# Patient Record
Sex: Female | Born: 1970 | Race: White | Hispanic: No | Smoking: Never smoker
Health system: Southern US, Community
[De-identification: ages and names within clinical notes are randomized; demographics above are authoritative.]

## PROBLEM LIST (undated history)

## (undated) DIAGNOSIS — R569 Unspecified convulsions: Secondary | ICD-10-CM

## (undated) DIAGNOSIS — I493 Ventricular premature depolarization: Secondary | ICD-10-CM

## (undated) DIAGNOSIS — K219 Gastro-esophageal reflux disease without esophagitis: Secondary | ICD-10-CM

## (undated) DIAGNOSIS — I959 Hypotension, unspecified: Secondary | ICD-10-CM

## (undated) DIAGNOSIS — F419 Anxiety disorder, unspecified: Secondary | ICD-10-CM

## (undated) DIAGNOSIS — G250 Essential tremor: Secondary | ICD-10-CM

## (undated) DIAGNOSIS — Z8711 Personal history of peptic ulcer disease: Secondary | ICD-10-CM

## (undated) HISTORY — DX: Hypotension, unspecified: I95.9

## (undated) HISTORY — DX: Gastro-esophageal reflux disease without esophagitis: K21.9

## (undated) HISTORY — PX: CHOLECYSTECTOMY: SHX55

## (undated) HISTORY — PX: ESOPHAGOGASTRODUODENOSCOPY: SHX1529

## (undated) HISTORY — PX: GASTRIC BYPASS: SHX52

## (undated) HISTORY — DX: Unspecified convulsions: R56.9

## (undated) HISTORY — DX: Ventricular premature depolarization: I49.3

## (undated) HISTORY — DX: Personal history of peptic ulcer disease: Z87.11

## (undated) HISTORY — DX: Essential tremor: G25.0

## (undated) HISTORY — PX: BREAST BIOPSY: SHX20

## (undated) HISTORY — DX: Anxiety disorder, unspecified: F41.9

---

## 2013-11-10 DIAGNOSIS — I493 Ventricular premature depolarization: Secondary | ICD-10-CM | POA: Insufficient documentation

## 2013-11-10 DIAGNOSIS — G40909 Epilepsy, unspecified, not intractable, without status epilepticus: Secondary | ICD-10-CM | POA: Insufficient documentation

## 2013-11-10 DIAGNOSIS — Z9884 Bariatric surgery status: Secondary | ICD-10-CM | POA: Insufficient documentation

## 2013-11-10 DIAGNOSIS — E538 Deficiency of other specified B group vitamins: Secondary | ICD-10-CM | POA: Insufficient documentation

## 2013-11-10 DIAGNOSIS — K219 Gastro-esophageal reflux disease without esophagitis: Secondary | ICD-10-CM | POA: Insufficient documentation

## 2013-12-25 DIAGNOSIS — Z803 Family history of malignant neoplasm of breast: Secondary | ICD-10-CM | POA: Insufficient documentation

## 2013-12-25 DIAGNOSIS — Z8041 Family history of malignant neoplasm of ovary: Secondary | ICD-10-CM | POA: Insufficient documentation

## 2014-02-18 DIAGNOSIS — Z9189 Other specified personal risk factors, not elsewhere classified: Secondary | ICD-10-CM | POA: Insufficient documentation

## 2016-01-08 DIAGNOSIS — G250 Essential tremor: Secondary | ICD-10-CM | POA: Insufficient documentation

## 2017-10-25 DIAGNOSIS — M7501 Adhesive capsulitis of right shoulder: Secondary | ICD-10-CM | POA: Insufficient documentation

## 2017-10-25 DIAGNOSIS — M7502 Adhesive capsulitis of left shoulder: Secondary | ICD-10-CM | POA: Insufficient documentation

## 2019-02-21 ENCOUNTER — Ambulatory Visit: Payer: 59 | Admitting: Internal Medicine

## 2019-02-25 ENCOUNTER — Ambulatory Visit: Payer: 59 | Admitting: Internal Medicine

## 2019-02-26 ENCOUNTER — Encounter: Payer: Self-pay | Admitting: Internal Medicine

## 2019-02-26 ENCOUNTER — Other Ambulatory Visit: Payer: Self-pay

## 2019-02-26 ENCOUNTER — Ambulatory Visit: Payer: 59 | Admitting: Internal Medicine

## 2019-02-26 VITALS — BP 103/68 | HR 62 | Temp 98.3°F | Resp 14 | Ht 65.0 in | Wt 170.0 lb

## 2019-02-26 DIAGNOSIS — G250 Essential tremor: Secondary | ICD-10-CM

## 2019-02-26 DIAGNOSIS — I493 Ventricular premature depolarization: Secondary | ICD-10-CM

## 2019-02-26 DIAGNOSIS — K219 Gastro-esophageal reflux disease without esophagitis: Secondary | ICD-10-CM

## 2019-02-26 DIAGNOSIS — R519 Headache, unspecified: Secondary | ICD-10-CM | POA: Insufficient documentation

## 2019-02-26 DIAGNOSIS — Z760 Encounter for issue of repeat prescription: Secondary | ICD-10-CM

## 2019-02-26 MED ORDER — PRIMIDONE 50 MG PO TABS: 50.0000 mg | ORAL_TABLET | Freq: Two times a day (BID) | ORAL | 3 refills | Status: DC

## 2019-02-26 MED ORDER — TOPIRAMATE 50 MG PO TABS: 50.0000 mg | ORAL_TABLET | Freq: Two times a day (BID) | ORAL | 3 refills | Status: DC

## 2019-02-26 MED ORDER — BYSTOLIC 5 MG PO TABS: 5.0000 mg | ORAL_TABLET | Freq: Every day | ORAL | 3 refills | Status: DC

## 2019-02-26 MED ORDER — OMEPRAZOLE 40 MG PO CPDR: 40.0000 mg | DELAYED_RELEASE_CAPSULE | Freq: Every day | ORAL | 3 refills | Status: DC

## 2019-02-26 NOTE — Progress Notes (Signed)
S -48 year old white female who presents for medication refills.  She recently relocated to the area, and is in need of refills for her medication she is currently taking.  Bystolic is a beta-blocker which was started for PVCs, and is helpful in controlling those.  Omeprazole is used for GERD symptoms, and she notes when she does not take that she is very symptomatic.  She does need it daily.  The primidone was started for a benign essential tremor which she has had since youth, and has been successful in controlling.  The topiramate was prescribed for control of headaches, and it has been very helpful.  She denies any concerning side effects with taking these medications.  She works as the Glass blower/designer in our medical office currently.   No other complaints   Allergies  Allergen Reactions  . Adhesive [Tape]   . Levaquin [Levofloxacin]   . Sulfa Antibiotics   . Zoloft [Sertraline Hcl]    Meds reviewed and as above and in the mediation tab  No tob hx  O - NAD BP 103/68 (BP Location: Right Arm, Patient Position: Sitting, Cuff Size: Large)   Pulse 62   Temp 98.3 F (36.8 C) (Oral)   Resp 14   Ht 5\' 5"  (1.651 m)   Wt 170 lb (77.1 kg)   LMP  (LMP Unknown)   SpO2 99%   BMI 28.29 kg/m   Affect was not flat, approp with conversation Further exam limited today  Ass/plan  1.  Medication refills- as above noted for the problems noted above.  The medications were refilled as requested, and she did ask for a 90-day supply with refills which I felt was appropriate. Follow-up as needed.

## 2019-02-27 DIAGNOSIS — Z1159 Encounter for screening for other viral diseases: Secondary | ICD-10-CM | POA: Diagnosis not present

## 2019-10-02 ENCOUNTER — Telehealth: Payer: Self-pay

## 2019-10-02 ENCOUNTER — Other Ambulatory Visit: Payer: Self-pay

## 2019-10-02 DIAGNOSIS — R197 Diarrhea, unspecified: Secondary | ICD-10-CM

## 2019-10-02 NOTE — Telephone Encounter (Signed)
Patient presents requesting gastro referral for chronic diarrhea.  AMD

## 2019-10-03 ENCOUNTER — Other Ambulatory Visit: Payer: 59

## 2019-10-03 ENCOUNTER — Other Ambulatory Visit: Payer: Self-pay

## 2019-10-03 DIAGNOSIS — Z0000 Encounter for general adult medical examination without abnormal findings: Secondary | ICD-10-CM

## 2019-10-04 LAB — CMP12+LP+TP+TSH+6AC+CBC/D/PLT
ALT: 87 IU/L — ABNORMAL HIGH (ref 0–32)
AST: 84 IU/L — ABNORMAL HIGH (ref 0–40)
Albumin/Globulin Ratio: 2 (ref 1.2–2.2)
Albumin: 4.3 g/dL (ref 3.8–4.8)
Alkaline Phosphatase: 95 IU/L (ref 39–117)
BUN/Creatinine Ratio: 8 — ABNORMAL LOW (ref 9–23)
BUN: 7 mg/dL (ref 6–24)
Basophils Absolute: 0 10*3/uL (ref 0.0–0.2)
Basos: 1 %
Bilirubin Total: <0.2 mg/dL (ref 0.0–1.2)
Calcium: 9.2 mg/dL (ref 8.7–10.2)
Chloride: 112 mmol/L — ABNORMAL HIGH (ref 96–106)
Chol/HDL Ratio: 2.1 ratio (ref 0.0–4.4)
Cholesterol, Total: 136 mg/dL (ref 100–199)
Creatinine, Ser: 0.93 mg/dL (ref 0.57–1.00)
EOS (ABSOLUTE): 0.2 10*3/uL (ref 0.0–0.4)
Eos: 4 %
Estimated CHD Risk: <0.5 times avg. (ref 0.0–1.0)
Free Thyroxine Index: 1.8 (ref 1.2–4.9)
GFR calc Af Amer: 84 mL/min/{1.73_m2} (ref >59)
GFR calc non Af Amer: 73 mL/min/{1.73_m2} (ref >59)
GGT: 76 IU/L — ABNORMAL HIGH (ref 0–60)
Globulin, Total: 2.1 g/dL (ref 1.5–4.5)
Glucose: 77 mg/dL (ref 65–99)
HDL: 64 mg/dL (ref >39)
Hematocrit: 34.3 % (ref 34.0–46.6)
Hemoglobin: 11.2 g/dL (ref 11.1–15.9)
Immature Grans (Abs): 0 10*3/uL (ref 0.0–0.1)
Immature Granulocytes: 0 %
Iron: 32 ug/dL (ref 27–159)
LDH: 205 IU/L (ref 119–226)
LDL Chol Calc (NIH): 58 mg/dL (ref 0–99)
Lymphocytes Absolute: 1.7 10*3/uL (ref 0.7–3.1)
Lymphs: 36 %
MCH: 28.6 pg (ref 26.6–33.0)
MCHC: 32.7 g/dL (ref 31.5–35.7)
MCV: 88 fL (ref 79–97)
Monocytes Absolute: 0.5 10*3/uL (ref 0.1–0.9)
Monocytes: 11 %
Neutrophils Absolute: 2.2 10*3/uL (ref 1.4–7.0)
Neutrophils: 48 %
Phosphorus: 3.4 mg/dL (ref 3.0–4.3)
Platelets: 207 10*3/uL (ref 150–450)
Potassium: 4.5 mmol/L (ref 3.5–5.2)
RBC: 3.92 x10E6/uL (ref 3.77–5.28)
RDW: 13 % (ref 11.7–15.4)
Sodium: 145 mmol/L — ABNORMAL HIGH (ref 134–144)
T3 Uptake Ratio: 23 % — ABNORMAL LOW (ref 24–39)
T4, Total: 7.9 ug/dL (ref 4.5–12.0)
TSH: 1.28 u[IU]/mL (ref 0.450–4.500)
Total Protein: 6.4 g/dL (ref 6.0–8.5)
Triglycerides: 70 mg/dL (ref 0–149)
Uric Acid: 3.1 mg/dL (ref 2.6–6.2)
VLDL Cholesterol Cal: 14 mg/dL (ref 5–40)
WBC: 4.6 10*3/uL (ref 3.4–10.8)

## 2019-10-04 LAB — VITAMIN B12: Vitamin B-12: 1326 pg/mL — ABNORMAL HIGH (ref 232–1245)

## 2019-10-04 LAB — MAGNESIUM: Magnesium: 2 mg/dL (ref 1.6–2.3)

## 2019-10-04 LAB — ZINC

## 2019-10-04 LAB — VITAMIN D 25 HYDROXY (VIT D DEFICIENCY, FRACTURES): Vit D, 25-Hydroxy: 26.7 ng/mL — ABNORMAL LOW (ref 30.0–100.0)

## 2019-10-09 ENCOUNTER — Other Ambulatory Visit: Payer: Self-pay

## 2019-10-09 DIAGNOSIS — Z0000 Encounter for general adult medical examination without abnormal findings: Secondary | ICD-10-CM

## 2019-10-09 NOTE — Progress Notes (Signed)
Labs for upcoming MD appointment.  AMD

## 2019-10-10 LAB — CMP12+LP+TP+TSH+6AC+CBC/D/PLT
ALT: 57 IU/L — ABNORMAL HIGH (ref 0–32)
AST: 50 IU/L — ABNORMAL HIGH (ref 0–40)
Albumin/Globulin Ratio: 1.8 (ref 1.2–2.2)
Albumin: 4.2 g/dL (ref 3.8–4.8)
Alkaline Phosphatase: 82 IU/L (ref 39–117)
BUN/Creatinine Ratio: 8 — ABNORMAL LOW (ref 9–23)
BUN: 7 mg/dL (ref 6–24)
Basophils Absolute: 0 10*3/uL (ref 0.0–0.2)
Basos: 0 %
Bilirubin Total: 0.2 mg/dL (ref 0.0–1.2)
Calcium: 9.3 mg/dL (ref 8.7–10.2)
Chloride: 109 mmol/L — ABNORMAL HIGH (ref 96–106)
Chol/HDL Ratio: 2.5 ratio (ref 0.0–4.4)
Cholesterol, Total: 145 mg/dL (ref 100–199)
Creatinine, Ser: 0.83 mg/dL (ref 0.57–1.00)
EOS (ABSOLUTE): 0.2 10*3/uL (ref 0.0–0.4)
Eos: 4 %
Estimated CHD Risk: <0.5 times avg. (ref 0.0–1.0)
Free Thyroxine Index: 1.8 (ref 1.2–4.9)
GFR calc Af Amer: 96 mL/min/{1.73_m2} (ref >59)
GFR calc non Af Amer: 84 mL/min/{1.73_m2} (ref >59)
GGT: 55 IU/L (ref 0–60)
Globulin, Total: 2.4 g/dL (ref 1.5–4.5)
Glucose: 84 mg/dL (ref 65–99)
HDL: 59 mg/dL (ref >39)
Hematocrit: 34.8 % (ref 34.0–46.6)
Hemoglobin: 11.1 g/dL (ref 11.1–15.9)
Immature Grans (Abs): 0 10*3/uL (ref 0.0–0.1)
Immature Granulocytes: 0 %
Iron: 83 ug/dL (ref 27–159)
LDH: 170 IU/L (ref 119–226)
LDL Chol Calc (NIH): 71 mg/dL (ref 0–99)
Lymphocytes Absolute: 1.9 10*3/uL (ref 0.7–3.1)
Lymphs: 39 %
MCH: 28.2 pg (ref 26.6–33.0)
MCHC: 31.9 g/dL (ref 31.5–35.7)
MCV: 89 fL (ref 79–97)
Monocytes Absolute: 0.5 10*3/uL (ref 0.1–0.9)
Monocytes: 9 %
Neutrophils Absolute: 2.4 10*3/uL (ref 1.4–7.0)
Neutrophils: 48 %
Phosphorus: 4 mg/dL (ref 3.0–4.3)
Platelets: 199 10*3/uL (ref 150–450)
Potassium: 4.2 mmol/L (ref 3.5–5.2)
RBC: 3.93 x10E6/uL (ref 3.77–5.28)
RDW: 13.1 % (ref 11.7–15.4)
Sodium: 142 mmol/L (ref 134–144)
T3 Uptake Ratio: 24 % (ref 24–39)
T4, Total: 7.7 ug/dL (ref 4.5–12.0)
TSH: 1.28 u[IU]/mL (ref 0.450–4.500)
Total Protein: 6.6 g/dL (ref 6.0–8.5)
Triglycerides: 80 mg/dL (ref 0–149)
Uric Acid: 3.2 mg/dL (ref 2.6–6.2)
VLDL Cholesterol Cal: 15 mg/dL (ref 5–40)
WBC: 5 10*3/uL (ref 3.4–10.8)

## 2019-10-18 DIAGNOSIS — R197 Diarrhea, unspecified: Secondary | ICD-10-CM | POA: Diagnosis not present

## 2019-10-18 DIAGNOSIS — R634 Abnormal weight loss: Secondary | ICD-10-CM | POA: Diagnosis not present

## 2019-10-21 DIAGNOSIS — K257 Chronic gastric ulcer without hemorrhage or perforation: Secondary | ICD-10-CM | POA: Diagnosis not present

## 2019-10-21 DIAGNOSIS — Z9884 Bariatric surgery status: Secondary | ICD-10-CM | POA: Diagnosis not present

## 2019-10-21 DIAGNOSIS — R197 Diarrhea, unspecified: Secondary | ICD-10-CM | POA: Diagnosis not present

## 2019-10-21 DIAGNOSIS — R634 Abnormal weight loss: Secondary | ICD-10-CM | POA: Diagnosis not present

## 2019-10-21 HISTORY — PX: COLONOSCOPY: SHX174

## 2019-10-21 LAB — HM COLONOSCOPY

## 2019-10-28 DIAGNOSIS — L853 Xerosis cutis: Secondary | ICD-10-CM | POA: Diagnosis not present

## 2019-11-22 ENCOUNTER — Other Ambulatory Visit: Payer: Self-pay

## 2019-11-22 DIAGNOSIS — Z0000 Encounter for general adult medical examination without abnormal findings: Secondary | ICD-10-CM

## 2019-11-27 LAB — CMP12+LP+TP+TSH+6AC+CBC/D/PLT
ALT: 21 IU/L (ref 0–32)
AST: 28 IU/L (ref 0–40)
Albumin/Globulin Ratio: 1.8 (ref 1.2–2.2)
Albumin: 3.9 g/dL (ref 3.8–4.8)
Alkaline Phosphatase: 82 IU/L (ref 48–121)
BUN/Creatinine Ratio: 9 (ref 9–23)
BUN: 8 mg/dL (ref 6–24)
Basophils Absolute: 0 10*3/uL (ref 0.0–0.2)
Basos: 1 %
Bilirubin Total: <0.2 mg/dL (ref 0.0–1.2)
Calcium: 9.8 mg/dL (ref 8.7–10.2)
Chloride: 105 mmol/L (ref 96–106)
Chol/HDL Ratio: 2 ratio (ref 0.0–4.4)
Cholesterol, Total: 151 mg/dL (ref 100–199)
Creatinine, Ser: 0.88 mg/dL (ref 0.57–1.00)
EOS (ABSOLUTE): 0.2 10*3/uL (ref 0.0–0.4)
Eos: 3 %
Estimated CHD Risk: <0.5 times avg. (ref 0.0–1.0)
Free Thyroxine Index: 1.5 (ref 1.2–4.9)
GFR calc Af Amer: 90 mL/min/{1.73_m2} (ref >59)
GFR calc non Af Amer: 78 mL/min/{1.73_m2} (ref >59)
GGT: 14 IU/L (ref 0–60)
Globulin, Total: 2.2 g/dL (ref 1.5–4.5)
Glucose: 83 mg/dL (ref 65–99)
HDL: 76 mg/dL (ref >39)
Hematocrit: 33 % — ABNORMAL LOW (ref 34.0–46.6)
Hemoglobin: 10.7 g/dL — ABNORMAL LOW (ref 11.1–15.9)
Immature Grans (Abs): 0 10*3/uL (ref 0.0–0.1)
Immature Granulocytes: 0 %
Iron: 26 ug/dL — ABNORMAL LOW (ref 27–159)
LDH: 149 IU/L (ref 119–226)
LDL Chol Calc (NIH): 64 mg/dL (ref 0–99)
Lymphocytes Absolute: 2.1 10*3/uL (ref 0.7–3.1)
Lymphs: 39 %
MCH: 28.5 pg (ref 26.6–33.0)
MCHC: 32.4 g/dL (ref 31.5–35.7)
MCV: 88 fL (ref 79–97)
Monocytes Absolute: 0.5 10*3/uL (ref 0.1–0.9)
Monocytes: 9 %
Neutrophils Absolute: 2.6 10*3/uL (ref 1.4–7.0)
Neutrophils: 48 %
Phosphorus: 4.4 mg/dL — ABNORMAL HIGH (ref 3.0–4.3)
Platelets: 190 10*3/uL (ref 150–450)
Potassium: 3.8 mmol/L (ref 3.5–5.2)
RBC: 3.75 x10E6/uL — ABNORMAL LOW (ref 3.77–5.28)
RDW: 13.6 % (ref 11.7–15.4)
Sodium: 138 mmol/L (ref 134–144)
T3 Uptake Ratio: 24 % (ref 24–39)
T4, Total: 6.2 ug/dL (ref 4.5–12.0)
TSH: 1.39 u[IU]/mL (ref 0.450–4.500)
Total Protein: 6.1 g/dL (ref 6.0–8.5)
Triglycerides: 50 mg/dL (ref 0–149)
Uric Acid: 2.8 mg/dL (ref 2.6–6.2)
VLDL Cholesterol Cal: 11 mg/dL (ref 5–40)
WBC: 5.4 10*3/uL (ref 3.4–10.8)

## 2019-11-27 LAB — ZINC: Zinc: 72 ug/dL (ref 44–115)

## 2019-11-27 LAB — CA 125: Cancer Antigen (CA) 125: 13.7 U/mL (ref 0.0–38.1)

## 2019-11-27 LAB — VITAMIN D 25 HYDROXY (VIT D DEFICIENCY, FRACTURES): Vit D, 25-Hydroxy: 45.4 ng/mL (ref 30.0–100.0)

## 2019-12-03 DIAGNOSIS — Z1231 Encounter for screening mammogram for malignant neoplasm of breast: Secondary | ICD-10-CM | POA: Diagnosis not present

## 2019-12-09 DIAGNOSIS — Z01419 Encounter for gynecological examination (general) (routine) without abnormal findings: Secondary | ICD-10-CM | POA: Diagnosis not present

## 2020-02-20 ENCOUNTER — Telehealth: Payer: Self-pay | Admitting: Emergency Medicine

## 2020-02-20 MED ORDER — LORAZEPAM 0.5 MG PO TABS: 0.5000 mg | ORAL_TABLET | Freq: Two times a day (BID) | ORAL | 1 refills | Status: DC | PRN

## 2020-02-20 NOTE — Telephone Encounter (Signed)
Called in meds for anxiety due to recent stress

## 2020-04-13 DIAGNOSIS — G250 Essential tremor: Secondary | ICD-10-CM | POA: Diagnosis not present

## 2020-04-13 DIAGNOSIS — Z8041 Family history of malignant neoplasm of ovary: Secondary | ICD-10-CM | POA: Diagnosis not present

## 2020-04-13 DIAGNOSIS — K219 Gastro-esophageal reflux disease without esophagitis: Secondary | ICD-10-CM | POA: Diagnosis not present

## 2020-04-13 DIAGNOSIS — G40909 Epilepsy, unspecified, not intractable, without status epilepticus: Secondary | ICD-10-CM | POA: Diagnosis not present

## 2020-04-13 DIAGNOSIS — K52832 Lymphocytic colitis: Secondary | ICD-10-CM | POA: Diagnosis not present

## 2020-04-13 DIAGNOSIS — Z803 Family history of malignant neoplasm of breast: Secondary | ICD-10-CM | POA: Diagnosis not present

## 2020-05-28 ENCOUNTER — Other Ambulatory Visit (HOSPITAL_COMMUNITY): Payer: Self-pay | Admitting: Family Medicine

## 2020-06-08 DIAGNOSIS — M50322 Other cervical disc degeneration at C5-C6 level: Secondary | ICD-10-CM | POA: Diagnosis not present

## 2020-06-08 DIAGNOSIS — M5136 Other intervertebral disc degeneration, lumbar region: Secondary | ICD-10-CM | POA: Diagnosis not present

## 2020-06-08 DIAGNOSIS — M9901 Segmental and somatic dysfunction of cervical region: Secondary | ICD-10-CM | POA: Diagnosis not present

## 2020-06-08 DIAGNOSIS — M7918 Myalgia, other site: Secondary | ICD-10-CM | POA: Diagnosis not present

## 2020-06-27 DIAGNOSIS — F419 Anxiety disorder, unspecified: Secondary | ICD-10-CM

## 2020-06-27 HISTORY — DX: Anxiety disorder, unspecified: F41.9

## 2020-07-14 ENCOUNTER — Emergency Department (HOSPITAL_COMMUNITY)
Admission: EM | Admit: 2020-07-14 | Discharge: 2020-07-14 | Disposition: A | Discharge status: Home / Self Care | Payer: 59 | Attending: Emergency Medicine | Admitting: Emergency Medicine

## 2020-07-14 ENCOUNTER — Encounter (HOSPITAL_COMMUNITY): Payer: Self-pay | Admitting: Emergency Medicine

## 2020-07-14 ENCOUNTER — Emergency Department (HOSPITAL_COMMUNITY): Payer: 59

## 2020-07-14 ENCOUNTER — Encounter: Payer: Self-pay | Admitting: Neurology

## 2020-07-14 ENCOUNTER — Other Ambulatory Visit: Payer: Self-pay

## 2020-07-14 DIAGNOSIS — Z20822 Contact with and (suspected) exposure to covid-19: Secondary | ICD-10-CM | POA: Diagnosis not present

## 2020-07-14 DIAGNOSIS — R0602 Shortness of breath: Secondary | ICD-10-CM | POA: Insufficient documentation

## 2020-07-14 DIAGNOSIS — R7989 Other specified abnormal findings of blood chemistry: Secondary | ICD-10-CM | POA: Diagnosis not present

## 2020-07-14 DIAGNOSIS — R1013 Epigastric pain: Secondary | ICD-10-CM | POA: Insufficient documentation

## 2020-07-14 DIAGNOSIS — K219 Gastro-esophageal reflux disease without esophagitis: Secondary | ICD-10-CM | POA: Insufficient documentation

## 2020-07-14 DIAGNOSIS — R079 Chest pain, unspecified: Secondary | ICD-10-CM | POA: Insufficient documentation

## 2020-07-14 DIAGNOSIS — R0789 Other chest pain: Secondary | ICD-10-CM | POA: Diagnosis not present

## 2020-07-14 DIAGNOSIS — I493 Ventricular premature depolarization: Secondary | ICD-10-CM | POA: Diagnosis not present

## 2020-07-14 DIAGNOSIS — M549 Dorsalgia, unspecified: Secondary | ICD-10-CM | POA: Diagnosis not present

## 2020-07-14 DIAGNOSIS — I2699 Other pulmonary embolism without acute cor pulmonale: Secondary | ICD-10-CM | POA: Diagnosis not present

## 2020-07-14 DIAGNOSIS — R42 Dizziness and giddiness: Secondary | ICD-10-CM | POA: Diagnosis not present

## 2020-07-14 LAB — BASIC METABOLIC PANEL
Anion gap: 9 (ref 5–15)
BUN: 9 mg/dL (ref 6–20)
CO2: 22 mmol/L (ref 22–32)
Calcium: 9.6 mg/dL (ref 8.9–10.3)
Chloride: 105 mmol/L (ref 98–111)
Creatinine, Ser: 0.92 mg/dL (ref 0.44–1.00)
GFR, Estimated: >60 mL/min (ref >60)
Glucose, Bld: 97 mg/dL (ref 70–99)
Potassium: 4 mmol/L (ref 3.5–5.1)
Sodium: 136 mmol/L (ref 135–145)

## 2020-07-14 LAB — CBC
HCT: 41 % (ref 36.0–46.0)
Hemoglobin: 13.1 g/dL (ref 12.0–15.0)
MCH: 30.5 pg (ref 26.0–34.0)
MCHC: 32 g/dL (ref 30.0–36.0)
MCV: 95.3 fL (ref 80.0–100.0)
Platelets: 171 10*3/uL (ref 150–400)
RBC: 4.3 MIL/uL (ref 3.87–5.11)
RDW: 12.5 % (ref 11.5–15.5)
WBC: 6.9 10*3/uL (ref 4.0–10.5)
nRBC: 0 % (ref 0.0–0.2)

## 2020-07-14 LAB — TROPONIN I (HIGH SENSITIVITY): Troponin I (High Sensitivity): 3 ng/L (ref <18)

## 2020-07-14 LAB — MAGNESIUM: Magnesium: 1.8 mg/dL (ref 1.7–2.4)

## 2020-07-14 LAB — SARS CORONAVIRUS 2 (TAT 6-24 HRS): SARS Coronavirus 2: NEGATIVE

## 2020-07-14 LAB — D-DIMER, QUANTITATIVE: D-Dimer, Quant: 1.8 ug/mL-FEU — ABNORMAL HIGH (ref 0.00–0.50)

## 2020-07-14 IMAGING — CT CT ANGIO CHEST
2 of 6 series · 19 of 36 positions shown · IV contrast (omnipaque)
Comparison: None.

CLINICAL DATA: PE suspected, positive D-dimer

EXAM:
CT ANGIOGRAPHY CHEST WITH CONTRAST
TECHNIQUE: Multidetector CT imaging of the chest was performed using the
standard protocol during bolus administration of intravenous
contrast. Multiplanar CT image reconstructions and MIPs were
obtained to evaluate the vascular anatomy.
CONTRAST:  80mL OMNIPAQUE IOHEXOL 350 MG/ML SOLN

[Series 7: pe thins · axial · 0.62mm/px · z∈[+17,+242]mm · 18 of 358 slices shown]
[im 18/358  lung]
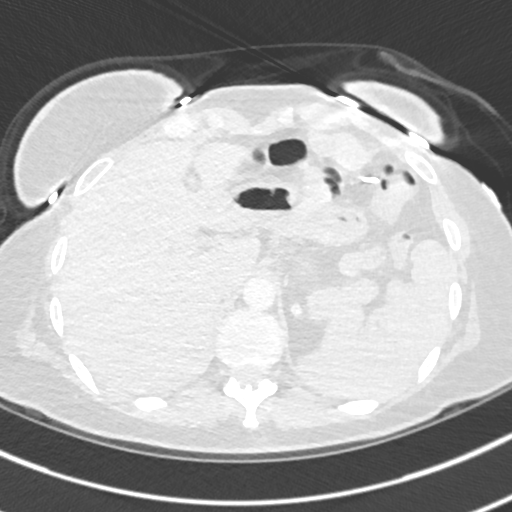
[im 36/358  mediastinal]
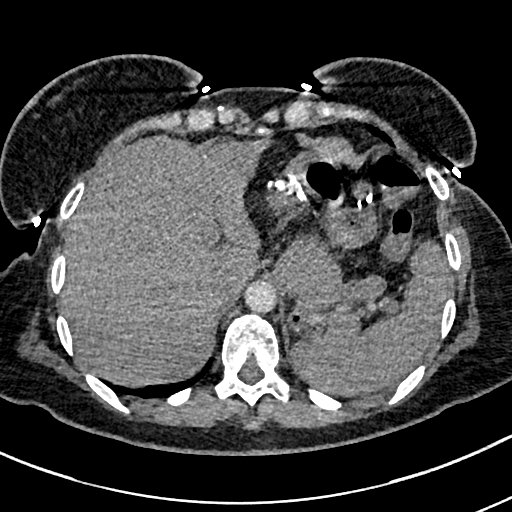
[im 54/358  lung]
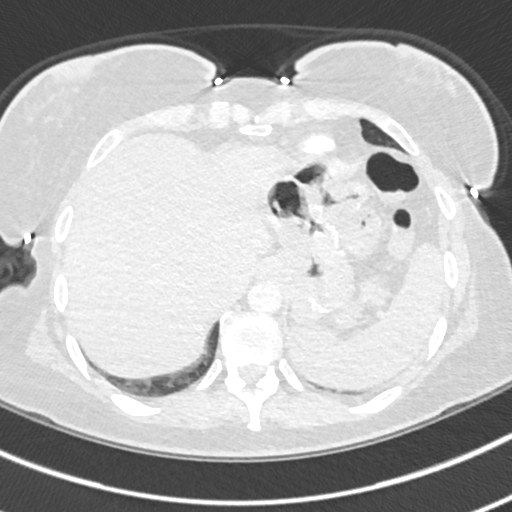
[im 72/358  mediastinal]
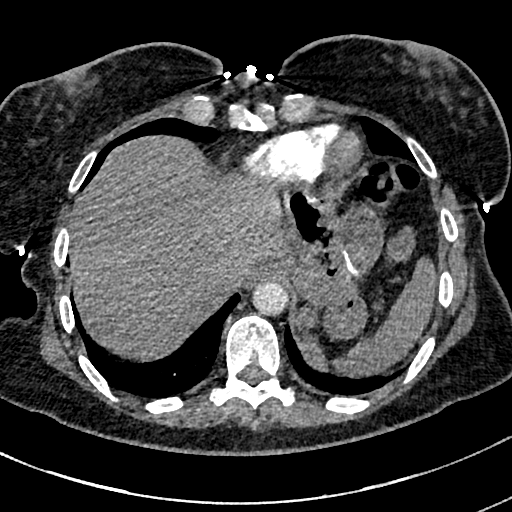
[im 90/358  lung]
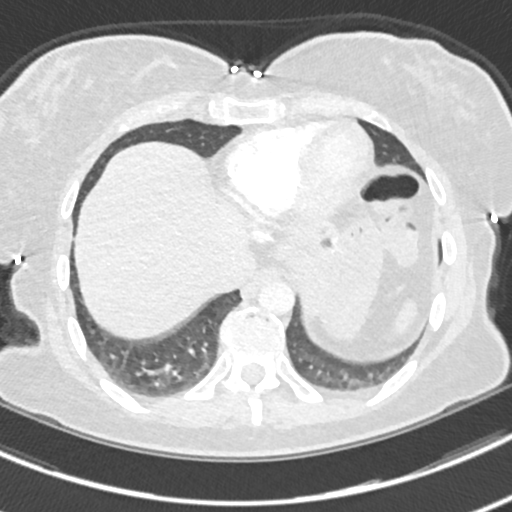
[im 108/358  mediastinal]
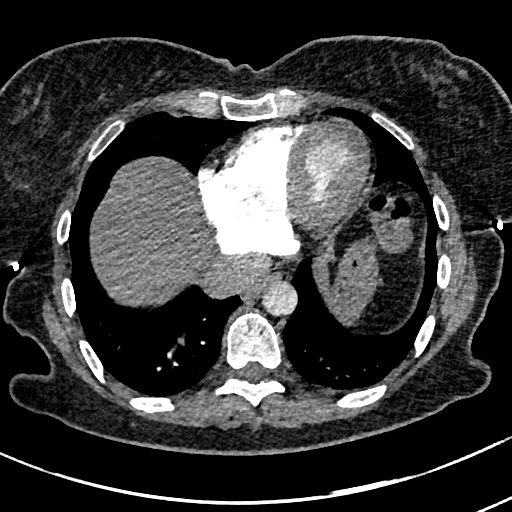
[im 125/358  lung]
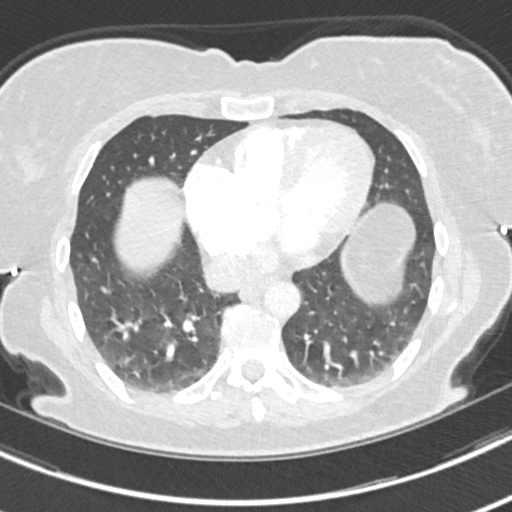
[im 143/358  mediastinal]
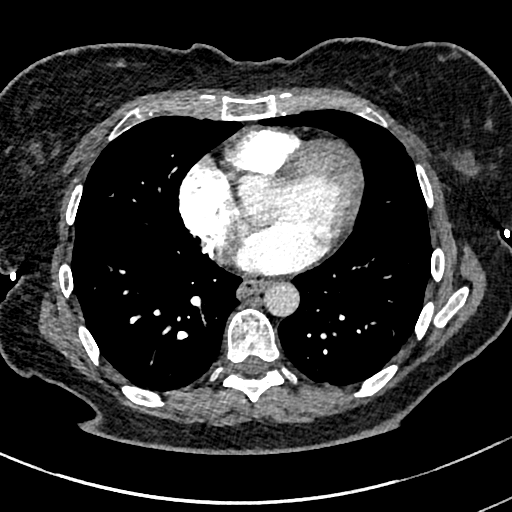
[im 161/358  lung]
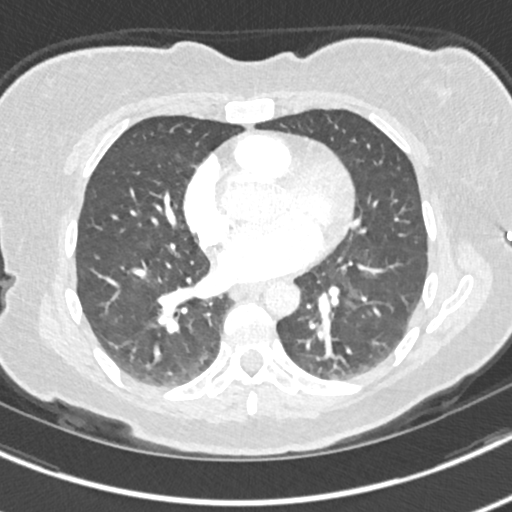
[im 197/358  mediastinal]
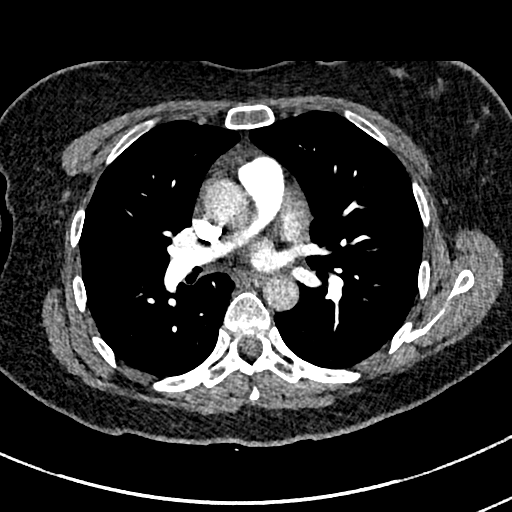
[im 215/358  lung]
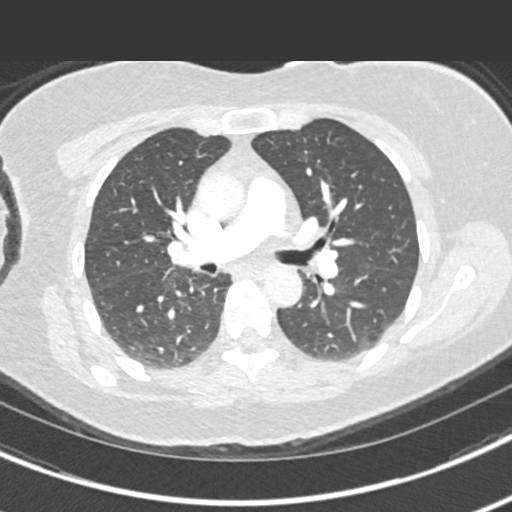
[im 233/358  mediastinal]
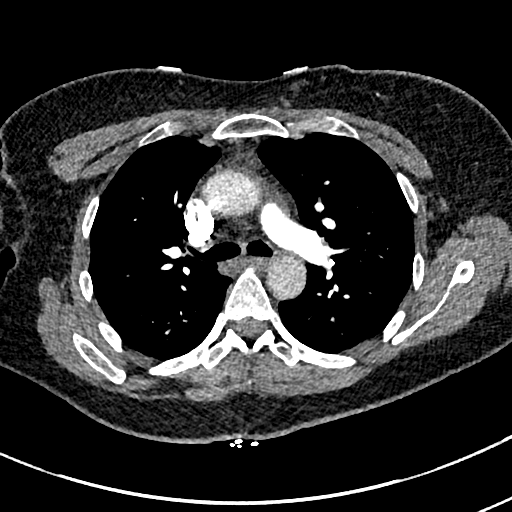
[im 250/358  lung]
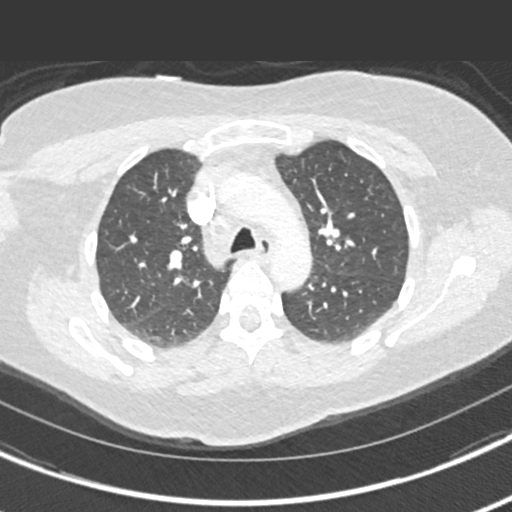
[im 268/358  mediastinal]
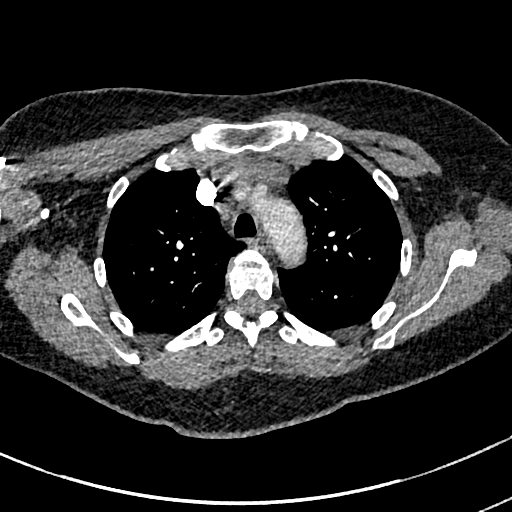
[im 286/358  lung]
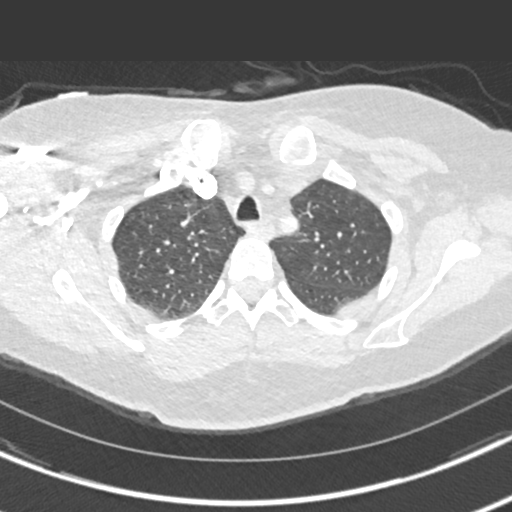
[im 304/358  mediastinal]
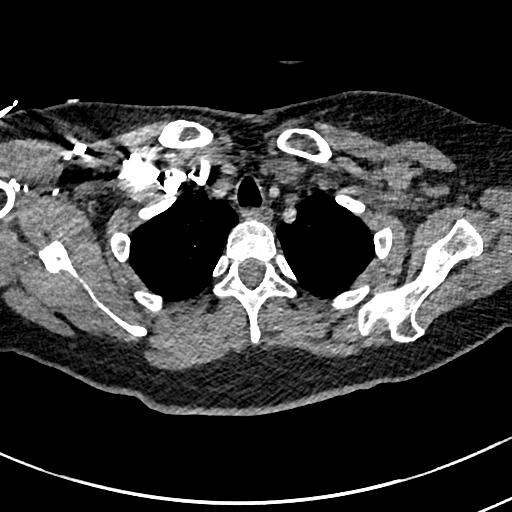
[im 322/358  lung]
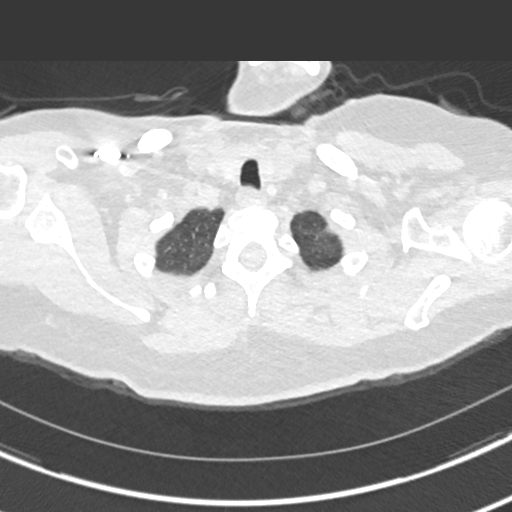
[im 340/358  mediastinal]
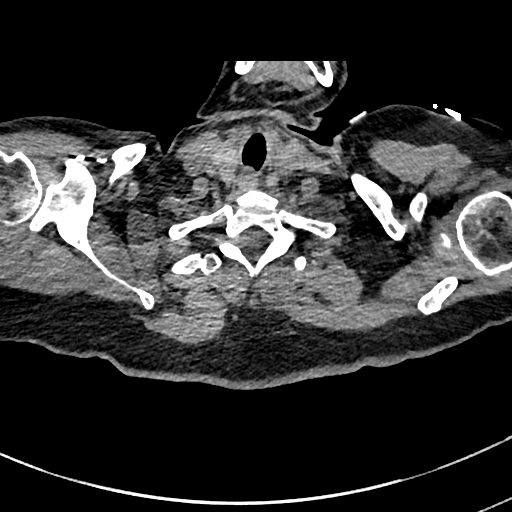

[Series 8: pe 2mm cor · coronal · 0.53mm/px · 1 of 118 slices shown]
[im 59/118  mediastinal]
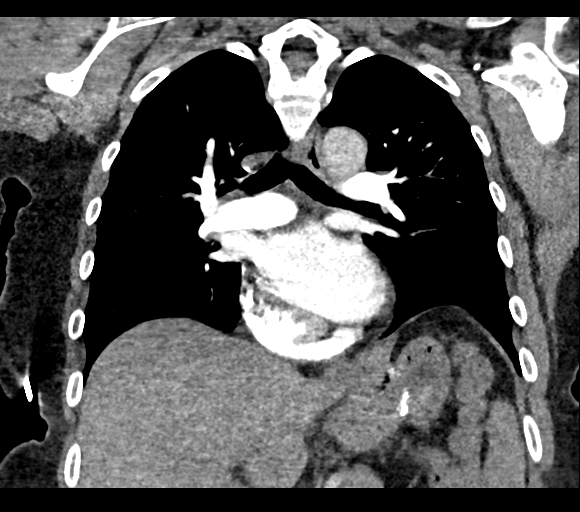

[19 of 36 positions shown; findings below may reference images not displayed]

FINDINGS: Cardiovascular: Satisfactory opacification of the pulmonary arteries
to the segmental level. No evidence of pulmonary embolism. Normal
heart size. No pericardial effusion.

Mediastinum/Nodes: No enlarged mediastinal, hilar, or axillary lymph
nodes. Thyroid gland, trachea, and esophagus demonstrate no
significant findings.

Lungs/Pleura: Lungs are clear. No pleural effusion or pneumothorax.

Upper Abdomen: No acute abnormality. Partially imaged postoperative
findings of gastric surgery, likely Roux type gastric bypass.

Musculoskeletal: No chest wall abnormality. No acute or significant
osseous findings.

Review of the MIP images confirms the above findings.
IMPRESSION: Negative examination for pulmonary embolism.

## 2020-07-14 IMAGING — CR DG CHEST 2V
2 series · 2 of 2 positions shown · non-contrast
Comparison: None

CLINICAL DATA: Chest pain, PVCs with dizziness

EXAM:
CHEST - 2 VIEW

[chest lat]
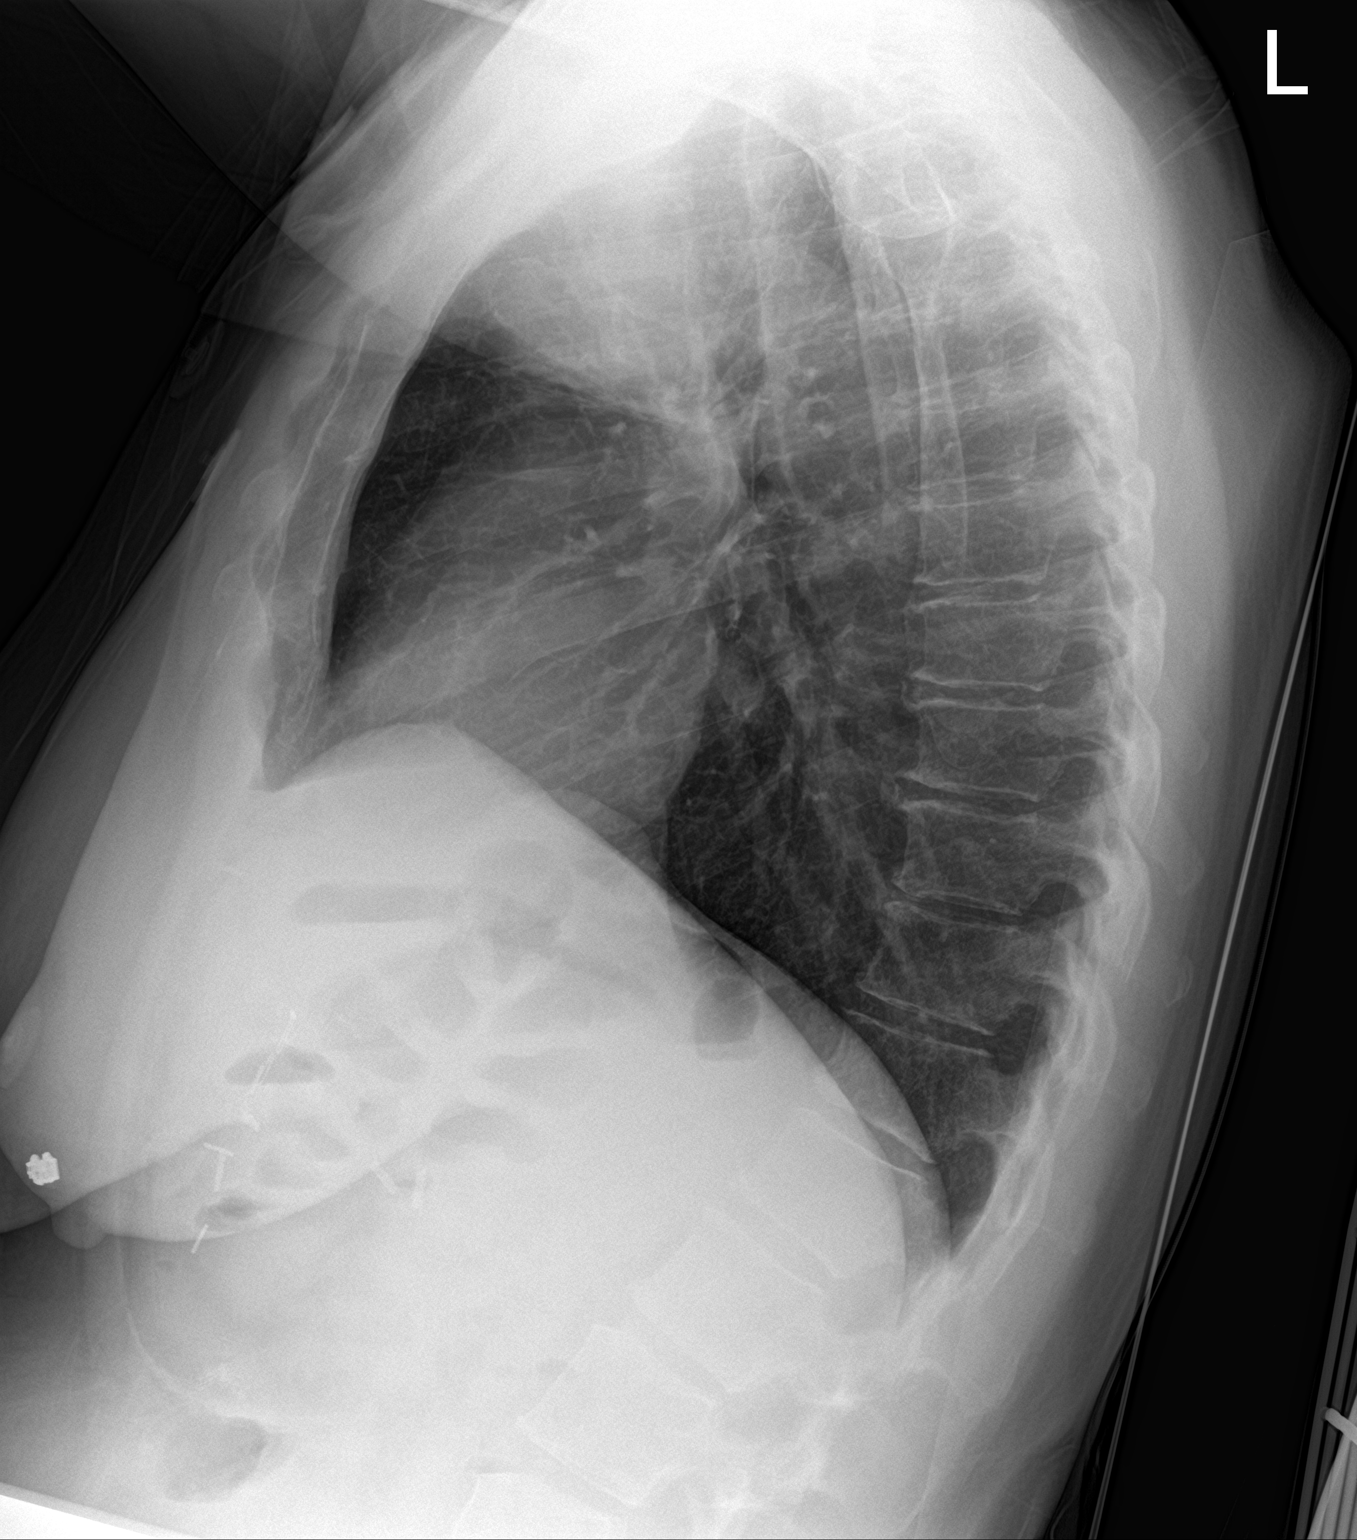

[chest ap]
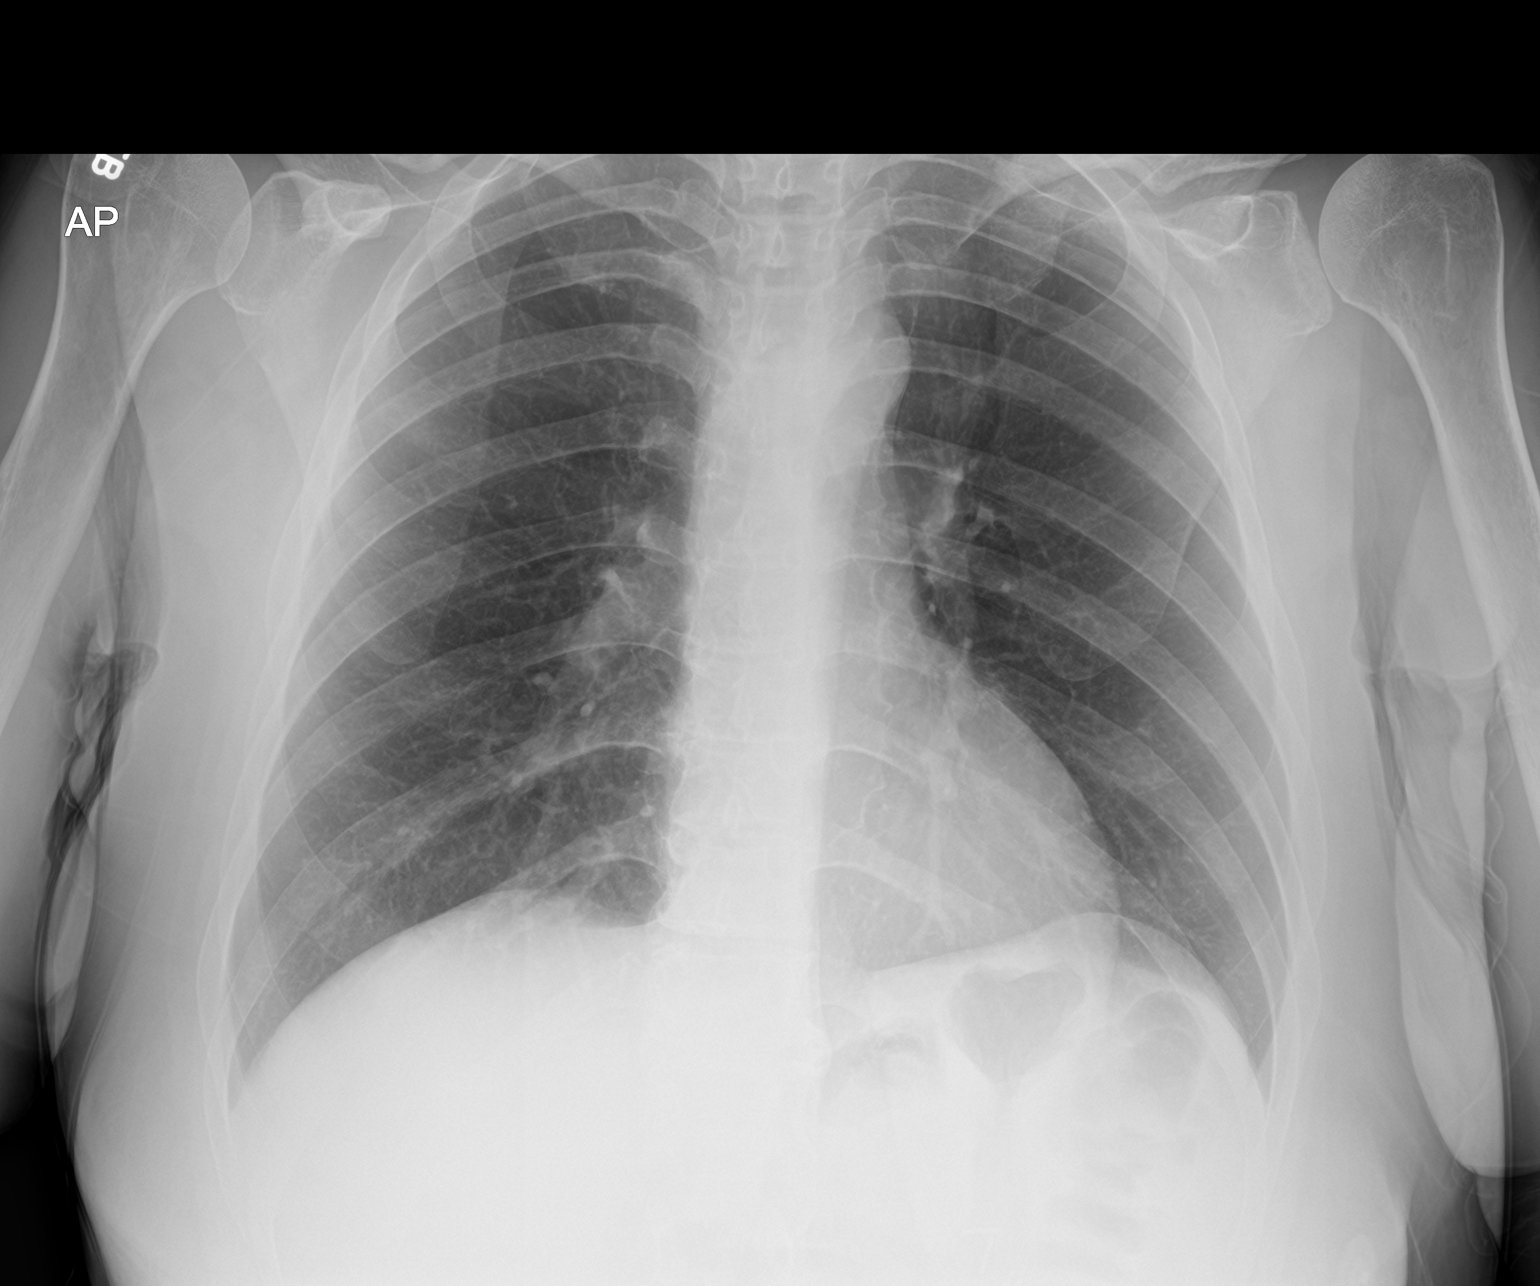

[2 of 2 positions shown; findings below may reference images not displayed]

FINDINGS: Trachea midline. Cardiomediastinal contours and hilar structures are
normal.

Lungs are clear.  No effusion.

On limited assessment no acute skeletal process.
IMPRESSION: No acute cardiopulmonary disease.

## 2020-07-14 MED ORDER — KETOROLAC TROMETHAMINE 30 MG/ML IJ SOLN
30.0000 mg | Freq: Once | INTRAMUSCULAR | Status: AC
  Administered 2020-07-14: 30 mg via INTRAVENOUS
  Filled 2020-07-14: qty 1

## 2020-07-14 MED ORDER — IOHEXOL 350 MG/ML SOLN
80.0000 mL | Freq: Once | INTRAVENOUS | Status: AC | PRN
  Administered 2020-07-14: 80 mL via INTRAVENOUS

## 2020-07-14 MED ORDER — ALUM & MAG HYDROXIDE-SIMETH 200-200-20 MG/5ML PO SUSP
30.0000 mL | Freq: Once | ORAL | Status: AC
  Administered 2020-07-14: 30 mL via ORAL
  Filled 2020-07-14: qty 30

## 2020-07-14 NOTE — ED Notes (Signed)
Patient transported to CT 

## 2020-07-14 NOTE — ED Triage Notes (Signed)
Pt here from work with c/o chest pain and abnormal ekg ,. NSR here pain to the center of her chest and left shoulder blade , no sob or n/v

## 2020-07-14 NOTE — ED Provider Notes (Signed)
[facility] Provider Note   CSN: [id number] Arrival date & time: 07/14/20  [id number]     History CC:  Chest discomfort  Latasha Ramirez is a 50 y.o. female with a history of PVCs, gastric bypass surgery, reflux, seizures, hypertension, presenting emergency department chest pain and back pain.  The patient reports onset of her symptoms yesterday evening, with a discomfort and tightness in her epigastrium radiating to the bilateral breast, and also between her shoulder blades.  The pain has been waxing and waning all night.  This morning she felt short of breath, particularly with ambulating from her car into the building at work.  She says she has never had this kind of symptoms before.  Chest pain is currently moderate intensity.  She denies any personal hx of MI, HTN, Diabetes, HLD, or smoking. Family hx of MI in father in his 7950's  No hemoptysis or asymmetric LE edema. Patient denies personal or family history of DVT or PE. No recent hormone use (including OCP); travel for >6 hours; prolonged immobilization for greater than 3 days; surgeries or trauma in the last 4 weeks; or malignancy with treatment within 6 months.  She has had covid vaccines, no sick contacts.  Reports diarrhea earlier this week.  Denies fevers, chills, cough, sore throat, congestion.   HPI     Past Medical History:  Diagnosis Date  . GERD (gastroesophageal reflux disease)   . History of bleeding ulcers   . Hypotension   . Seizures [facility](HCC)     Patient Active Problem List   Diagnosis Date Noted  . Nonintractable headache 02/26/2019  . Bilateral adhesive capsulitis of shoulders 10/25/2017  . Essential tremor 01/08/2016  . Increased risk of breast cancer 02/18/2014  . Family history of breast cancer 12/25/2013  . Family history of ovarian cancer 12/25/2013  . PVC's (premature ventricular contractions) 11/10/2013  . GERD (gastroesophageal reflux disease) 11/10/2013  . B12  deficiency 11/10/2013  . H/O gastric bypass 11/10/2013  . Seizure disorder (HCC) 11/10/2013    Past Surgical History:  Procedure Laterality Date  . CESAREAN SECTION    . CHOLECYSTECTOMY    . ESOPHAGOGASTRODUODENOSCOPY    . GASTRIC BYPASS       OB History   No obstetric history on file.     Family History  Problem Relation Age of Onset  . Ovarian cancer Mother   . Heart disease Father   . Diabetes Father   . Diabetes Maternal Grandmother   . Suicidality Maternal Grandfather   . Diabetes Paternal Grandmother   . Skin cancer Paternal Grandfather     Social History   Tobacco Use  . Smoking status: Never Smoker  . Smokeless tobacco: Never Used  Substance Use Topics  . Alcohol use: Yes    Comment: socially    Home Medications Prior to Admission medications   Medication Sig Start Date End Date Taking? Authorizing Provider  BYSTOLIC 5 MG tablet Take 1 tablet (5 mg total) by mouth daily. 02/26/19   Jamelle HaringHendrickson, Clifford D, MD  gabapentin (NEURONTIN) 300 MG capsule Take by mouth. 11/16/17   [provider]  LORazepam (ATIVAN) 0.5 MG tablet Take 1 tablet (0.5 mg total) by mouth 2 (two) times daily as needed for anxiety. 02/20/20   Emily FilbertWilliams, Jonathan E, MD  omeprazole (PRILOSEC) 40 MG capsule Take 1 capsule (40 mg total) by mouth daily. 02/26/19   Jamelle HaringHendrickson, Clifford D, MD  primidone (MYSOLINE) 50 MG tablet Take 1 tablet (50 mg  total) by mouth 2 (two) times daily. 02/26/19   Jamelle Haring, MD  topiramate (TOPAMAX) 50 MG tablet Take 1 tablet (50 mg total) by mouth 2 (two) times daily. 02/26/19   Jamelle Haring, MD    Allergies    Adhesive [tape], Levaquin [levofloxacin], Zoloft [sertraline hcl], and Sulfa antibiotics  Review of Systems   Review of Systems  Constitutional: Negative for chills and fever.  Eyes: Negative for pain and visual disturbance.  Respiratory: Positive for shortness of breath. Negative for cough.   Cardiovascular: Positive for  chest pain. Negative for palpitations.  Gastrointestinal: Negative for abdominal pain and vomiting.  Genitourinary: Negative for dysuria and hematuria.  Musculoskeletal: Positive for arthralgias, back pain and myalgias.  Skin: Negative for color change and rash.  Neurological: Positive for light-headedness and headaches. Negative for syncope.  Psychiatric/Behavioral: Negative for agitation and confusion.  All other systems reviewed and are negative.   Physical Exam Updated Vital Signs BP (!) 95/37 (BP Location: Right Arm)   Pulse (!) 57   Temp 98 F (36.7 C) (Oral)   Resp 16   Ht 5\' 5"  (1.651 m)   Wt 78.5 kg   SpO2 98%   BMI 28.79 kg/m   Physical Exam Constitutional:      General: She is not in acute distress. HENT:     Head: Normocephalic and atraumatic.  Eyes:     Conjunctiva/sclera: Conjunctivae normal.     Pupils: Pupils are equal, round, and reactive to light.  Cardiovascular:     Rate and Rhythm: Normal rate and regular rhythm.     Pulses: Normal pulses.  Pulmonary:     Effort: Pulmonary effort is normal. No respiratory distress.  Abdominal:     General: There is no distension.     Tenderness: There is no abdominal tenderness.  Skin:    General: Skin is warm and dry.  Neurological:     General: No focal deficit present.     Mental Status: She is alert and oriented to person, place, and time. Mental status is at baseline.  Psychiatric:        Mood and Affect: Mood normal.        Behavior: Behavior normal.     ED Results / Procedures / Treatments   Labs (all labs ordered are listed, but only abnormal results are displayed) Labs Reviewed  D-DIMER, QUANTITATIVE (NOT AT [facility]) - Abnormal; Notable for the following components:      Result Value   D-Dimer, Quant 1.80 (*)    All other components within normal limits  SARS CORONAVIRUS 2 (TAT 6-24 HRS)  BASIC METABOLIC PANEL  CBC  MAGNESIUM  TROPONIN I (HIGH SENSITIVITY)    EKG EKG  Interpretation  Date/Time:  Tuesday July 14 2020 09:14:01 EST Ventricular Rate:  78 PR Interval:  160 QRS Duration: 72 QT Interval:  362 QTC Calculation: 412 R Axis:   63 Text Interpretation: Normal sinus rhythm Low voltage QRS Borderline ECG No STEMI Confirmed by [id number] [phone]) on 07/14/2020 10:17:33 AM   Radiology DG Chest 2 View  Result Date: 07/14/2020 CLINICAL DATA:  Chest pain, PVCs with dizziness EXAM: CHEST - 2 VIEW COMPARISON:  None FINDINGS: Trachea midline. Cardiomediastinal contours and hilar structures are normal. Lungs are clear.  No effusion. On limited assessment no acute skeletal process. IMPRESSION: No acute cardiopulmonary disease. Electronically Signed   By: 07/16/2020 M.D.   On: 07/14/2020 09:45   CT Angio Chest PE W and/or Wo  Contrast  Result Date: 07/14/2020 CLINICAL DATA:  PE suspected, positive D-dimer EXAM: CT ANGIOGRAPHY CHEST WITH CONTRAST TECHNIQUE: Multidetector CT imaging of the chest was performed using the standard protocol during bolus administration of intravenous contrast. Multiplanar CT image reconstructions and MIPs were obtained to evaluate the vascular anatomy. CONTRAST:  21mL OMNIPAQUE IOHEXOL 350 MG/ML SOLN COMPARISON:  None. FINDINGS: Cardiovascular: Satisfactory opacification of the pulmonary arteries to the segmental level. No evidence of pulmonary embolism. Normal heart size. No pericardial effusion. Mediastinum/Nodes: No enlarged mediastinal, hilar, or axillary lymph nodes. Thyroid gland, trachea, and esophagus demonstrate no significant findings. Lungs/Pleura: Lungs are clear. No pleural effusion or pneumothorax. Upper Abdomen: No acute abnormality. 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 Musculoskeletal: No chest wall abnormality. No acute or significant osseous findings. Review of the MIP images confirms the above findings. IMPRESSION: Negative examination for pulmonary embolism.  Electronically Signed   By: Lauralyn Primes M.D.   On: 07/14/2020 13:22    Procedures Procedures (including critical care time)  Medications Ordered in ED Medications  ketorolac (TORADOL) 30 MG/ML injection 30 mg (30 mg Intravenous Given 07/14/20 1108)  alum & mag hydroxide-simeth (MAALOX/MYLANTA) 200-200-20 MG/5ML suspension 30 mL (30 mLs Oral Given 07/14/20 1107)  iohexol (OMNIPAQUE) 350 MG/ML injection 80 mL (80 mLs Intravenous Contrast Given 07/14/20 1313)    ED Course  I have reviewed the triage vital signs and the nursing notes.  Pertinent labs & imaging results that were available during my care of the patient were reviewed by me and considered in my medical decision making (see chart for details).  This patient presents to the Emergency Department with complaint of chest pain. This involves an extensive number of treatment options, and is a complaint that carries with it a high risk of complications and morbidity.  The differential diagnosis includes ACS vs Pneumothorax vs PE vs Reflux/Gastritis vs MSK pain vs Pneumonia vs other.  I ordered, reviewed, and interpreted labs, including BMP and CBC.  There were no immediate, life-threatening emergencies found in this labwork.  The patient's troponin level was 3 with > 6 hours of symptoms - unlikely ACS at this time.  Ddimer was elevated at 1.8.  Covid test is pending. I ordered medication IV toradol and GI cocktail for chest pain, possible muscular pain or gastritis I ordered imaging studies which included DG chest, CT PE I independently visualized and interpreted imaging which showed no acute cardiopulmonary process, no PE I personally reviewed the patients ECG which showed sinus rhythm with no acute ischemic findings  After the interventions stated above, I reevaluated the patient and found that they remained clinically stable.  Based on the patient's clinical exam, vital signs, risk factors, and ED testing, I felt that the patient's  overall risk of life-threatening emergency such as ACS, PE, aortic dissection, or sepsis was low.   I discussed outpatient follow up with primary care provider, and provided specialist office number on the patient's discharge paper if a referral was deemed necessary.  Return precautions were discussed with the patient.  I felt the patient was clinically stable for discharge.  Referral placed to neurology as she has not established care locally yet regarding her chronic seizure management.  She has a PCP appointment tomorrow for follow up.   Clinical Course as of 07/14/20 1647  Tue Jul 14, 2020  1055 Ddimer positive, ordered CT PE.  Discussed with pt who agrees [MT]  1336 CT PE negative.  Pt reassessed, stable  for discharge [MT]    Clinical Course User Index [MT] Osiris Odriscoll, Kermit Balo, MD    Final Clinical Impression(s) / ED Diagnoses Final diagnoses:  Chest pain, unspecified type    Rx / DC Orders ED Discharge Orders         Ordered    Ambulatory referral to Neurology       Comments: An appointment is requested in approximately: 1 week Seizure disorder [place] - chronic headaches - needs to establish care   07/14/20 1407           Terald Sleeper, MD 07/14/20 [phone]

## 2020-07-15 ENCOUNTER — Encounter: Payer: Self-pay | Admitting: Medical

## 2020-07-15 ENCOUNTER — Ambulatory Visit: Payer: 59 | Admitting: Medical

## 2020-07-15 VITALS — BP 102/70 | HR 73 | Ht 65.0 in | Wt 178.6 lb

## 2020-07-15 DIAGNOSIS — H539 Unspecified visual disturbance: Secondary | ICD-10-CM | POA: Insufficient documentation

## 2020-07-15 DIAGNOSIS — R0683 Snoring: Secondary | ICD-10-CM | POA: Insufficient documentation

## 2020-07-15 DIAGNOSIS — G8929 Other chronic pain: Secondary | ICD-10-CM

## 2020-07-15 DIAGNOSIS — R519 Headache, unspecified: Secondary | ICD-10-CM | POA: Diagnosis not present

## 2020-07-15 DIAGNOSIS — R6882 Decreased libido: Secondary | ICD-10-CM | POA: Insufficient documentation

## 2020-07-15 DIAGNOSIS — R109 Unspecified abdominal pain: Secondary | ICD-10-CM | POA: Insufficient documentation

## 2020-07-15 DIAGNOSIS — G250 Essential tremor: Secondary | ICD-10-CM

## 2020-07-15 DIAGNOSIS — M542 Cervicalgia: Secondary | ICD-10-CM | POA: Diagnosis not present

## 2020-07-15 DIAGNOSIS — Z8719 Personal history of other diseases of the digestive system: Secondary | ICD-10-CM | POA: Diagnosis not present

## 2020-07-15 DIAGNOSIS — T50905A Adverse effect of unspecified drugs, medicaments and biological substances, initial encounter: Secondary | ICD-10-CM | POA: Insufficient documentation

## 2020-07-15 DIAGNOSIS — R002 Palpitations: Secondary | ICD-10-CM | POA: Insufficient documentation

## 2020-07-15 DIAGNOSIS — I493 Ventricular premature depolarization: Secondary | ICD-10-CM | POA: Insufficient documentation

## 2020-07-15 NOTE — Progress Notes (Signed)
Done

## 2020-07-15 NOTE — Progress Notes (Signed)
Subjective:     Patient ID: Latasha Ramirez, female   DOB: 1970/09/15, 50 y.o.   MRN: [id number]   HPI Chief Complaint  Patient presents with  . New Patient (Initial Visit)    New patient for headache and neck pain    Medical team:  Neurology in the past, [facility]  Cardiology in the past, [facility]  GI in the past, [facility]  Dr. Genia Ramirez, GI with [facility]  New patient establishing care today.  Was working for [employer] but just recently joined [employer].  Been here in [place] 1.5 years.  Recently started with [employer] [employer].  Work site Field seismologist.   Initially made appt due to headache and neck pain.  Been waking up with headaches and neck pain for a few months.  Has been seeing chiropractor, has improved ROM but still having pain and headaches.   Feels like she has a pain and fullness in back of head.  Headaches last a few hours but gone by lunch.   Most of the time awakes with them, but sometimes occasional afternoon headache.  occasionally gets numbness in 4th and 5th fingers bilat.  No loss of vision or hearing.  No prior diagnoses of headache disorder.  No headache with sex.   Not exercising.   Sexual desire is gone on the primidone.   Takes systolic for PVCs, been on this since 2014.  Vision is changing.   Hasn't seen eye doctor since 08/2018.    Has discomfort in right ear/face.  In the past gets pain in right ear if earache or infection.    2 nights ago had some chest discomfort.   yesterday morning felt same, after walking from car to work, felt tired and weak.   Felt SOB.   Had EKG done at her office, went to ED was seen in emergency dept.  troponin and EKG fine other than PVCs.  Had + d-dimer, had negative chest CT.  Then sent her home.   Still feels fatigue, and can still feel the PVC or palpitations.    In December when they filled her prescription of bystolic was switched to generic not the name brand.  Has had problems with the  bystolic generic in the past.    This week her colitis has flared up.  No current fever, no blood in stool, no abdominal pain.   Has no prior dx of IBS.  Had colonoscopy 09/2019 with [facility], Dr. Genia Ramirez.  Diagnosed with colitis.  Has had gastric bypass 2005.     Uses Primidone for essential tremor.   Lives with partner.  Snores some.  Most of the time awakes rested.   Sometimes gets daytime somnolence.  No prior sleep study.   hasn't used lorazepam in a while.  Was on it prior more frequency but had very stressful job with city of [place].     March 30 [place] neurology    Past Medical History:  Diagnosis Date  . GERD (gastroesophageal reflux disease)   . History of bleeding ulcers   . Hypotension   . Seizures (HCC)    last seizure 2010.  last EEG 2015    Review of Systems As in subjective       Objective:   Physical Exam Due to coronavirus pandemic stay at home measures, patient visit was virtual and they were not examined in person.   BP 102/70   Pulse 73   Ht 5\' 5"  (1.651 m)  Wt 178 lb 9.6 oz (81 kg)   SpO2 95%   BMI 29.72 kg/m     General appearence: alert, no distress, WD/WN, white female HEENT: normocephalic, sclerae anicteric, PERRLA, EOMi, nares patent, no discharge or erythema, pharynx normal Oral cavity: MMM, no lesions Neck: supple, no lymphadenopathy, no thyromegaly, no masses Heart: RRR, normal S1, S2, no murmurs Lungs: CTA bilaterally, no wheezes, rhonchi, or rales Back: non tender Musculoskeletal: nontender, no swelling, no obvious deformity Extremities: no edema, no cyanosis, no clubbing Pulses: 2+ symmetric, upper and lower extremities, normal cap refill Neurological: alert, oriented x 3, CN2-12 intact, strength normal upper extremities and lower extremities, sensation normal throughout, DTRs 2+ throughout, no cerebellar signs, gait normal Psychiatric: normal affect, behavior normal, pleasant        Assessment:     Encounter  Diagnoses  Name Primary?  Marland Kitchen Nonintractable headache, unspecified chronicity pattern, unspecified headache type Yes  . Neck pain   . Abdominal discomfort   . History of colitis   . Morning headache   . Essential tremor   . Low libido   . Adverse effect of drug, initial encounter   . Vision changes   . Snoring   . Chronic nonintractable headache, unspecified headache type   . PVC (premature ventricular contraction)   . Palpitation        Plan:     She is a new patient with several concerns today.  Morning headaches, for the last few months.  We discussed possible differential diagnosis.  We will refer for MRI brain.  She does have a consult new visit scheduled with neurology but not until end of March.  We also discussed the possibility of sleep study and sleep apnea.  Neck pain-currently seeing chiropractor.  Likely related to the headaches or possibly contribute to headaches.   History of colitis, abdominal discomfort-we will request prior colonoscopy and GI notes.  She denies history of IBS.  She notes prior colonoscopy did not show anything of concern.  I suspect this could be IBS related.  No signs or symptoms currently suggesting infection.  We also discussed the possibility of food allergy or insensitivity  Low libido-likely related to her primidone medicine  Essential tremor-advise she talk to neurology about possibly changing medicines for the essential tremor given some of her adverse effects  PVC, palpitation-continue Bystolic but referral back to cardiology here locally    [name] was seen today for new patient (initial visit).  Diagnoses and all orders for this visit:  Nonintractable headache, unspecified chronicity pattern, unspecified headache type  Neck pain  Abdominal discomfort  History of colitis  Morning headache  Essential tremor  Low libido  Adverse effect of drug, initial encounter  Vision changes  Snoring  Chronic nonintractable  headache, unspecified headache type -     MR Brain Wo Contrast; Future  PVC (premature ventricular contraction) -     Ambulatory referral to Cardiology  Palpitation -     Ambulatory referral to Cardiology  f/u pending studies, records

## 2020-07-19 NOTE — Progress Notes (Signed)
Patient referred by Carlena Hurl, PA-C for palpitations  Subjective:   Latasha Ramirez, female    DOB: 1971/05/31, 50 y.o.   MRN: [id number]   Chief Complaint  Patient presents with  . Palpitations  . pvc  . Follow-up    HPI  50 y.o. 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 She moved from [place] in 2020. She was last seen by a cardiologist in [facility], Dr. Forestine Chute and diagnosed with symptomatic PVC's. She was initially put on Toprol, later switched to Bystolic due to hypotension. She did really well on bystolic until it was switched to generic nebivolol. Since then, patient's symptoms of palpitations have worsened, She is requiring to take 2 pills instead of 1, which has correlated with further hypotension. She denies chest pain, shortness of breath, leg edema, orthopnea, PND, TIA/syncope.     Past Medical History:  Diagnosis Date  . GERD (gastroesophageal reflux disease)   . History of bleeding ulcers   . Hypotension   . Seizures ([place])    last seizure 2010.  last EEG 2015     Past Surgical History:  Procedure Laterality Date  . CESAREAN SECTION    . CHOLECYSTECTOMY    . ESOPHAGOGASTRODUODENOSCOPY    . GASTRIC BYPASS       Social History   Tobacco Use  Smoking Status Never Smoker  Smokeless Tobacco Never Used    Social History   Substance and Sexual Activity  Alcohol Use Yes   Comment: socially, rarely     Family History  Problem Relation Age of Onset  . Ovarian cancer Mother   . Cancer Mother 59       ovarian  . Heart disease Father 42       died with MI  . Diabetes Father   . Diabetes Maternal Grandmother   . Suicidality Maternal Grandfather   . Diabetes Paternal Grandmother   . Skin cancer Paternal Grandfather      Current Outpatient Medications on File Prior to Visit  Medication Sig Dispense Refill  . BYSTOLIC 5 MG tablet Take 1 tablet (5 mg total) by mouth daily. 90  tablet 3  . LORazepam (ATIVAN) 0.5 MG tablet Take 1 tablet (0.5 mg total) by mouth 2 (two) times daily as needed for anxiety. 30 tablet 1  . omeprazole (PRILOSEC) 40 MG capsule Take 1 capsule (40 mg total) by mouth daily. 90 capsule 3  . primidone (MYSOLINE) 50 MG tablet Take 1 tablet (50 mg total) by mouth 2 (two) times daily. 180 tablet 3  . topiramate (TOPAMAX) 50 MG tablet Take 1 tablet (50 mg total) by mouth 2 (two) times daily. 180 tablet 3   No current facility-administered medications on file prior to visit.    Cardiovascular and other pertinent studies:  EKG 07/14/2020: Sinus rhythm Normal EKG   Recent labs: 07/14/2020: Glucose 97, BUN/Cr 9/0.92. EGFR >60. Na/K 136/4.0. Rest of the CMP normal H/H 13/41. MCV 95. Platelets 171 HbA1C N/A Trop HS 3  10/2019: Chol 151, TG 50, HDL 76, LDL 64 TSH N/A   Review of Systems  Cardiovascular: Positive for palpitations. Negative for chest pain, dyspnea on exertion, leg swelling and syncope.  Neurological: Positive for light-headedness.         Vitals:   07/20/20 0844  BP: (!) 96/57  Pulse: 60  Resp: 16  Temp: 97.8 F (36.6 C)  SpO2: 100%     Body mass index  is 29.62 kg/m. Filed Weights   07/20/20 0844  Weight: 178 lb (80.7 kg)     Objective:   Physical Exam Vitals and nursing note reviewed.  Constitutional:      General: She is not in acute distress. Neck:     Vascular: No JVD.  Cardiovascular:     Rate and Rhythm: Normal rate and regular rhythm.     Pulses: Normal pulses.     Heart sounds: Normal heart sounds. No murmur heard.   Pulmonary:     Effort: Pulmonary effort is normal.     Breath sounds: Normal breath sounds. No wheezing or rales.  Musculoskeletal:     Right lower leg: No edema.     Left lower leg: No edema.          Assessment & Recommendations:   50 y.o. Caucasian female with symptomatic PVC's  Symptomatic PVC's: Revert back to Bystolic, instead of generic nebivolol. If  symptoms do not improve, will then place on cardiac telemetry. Otherwise, I will see her on as needed basis   Thank you for referring the patient to [place]. Please feel free to contact with any questions.   Nigel Mormon, MD Pager: [phone] Office: [phone]

## 2020-07-20 ENCOUNTER — Other Ambulatory Visit: Payer: Self-pay

## 2020-07-20 ENCOUNTER — Encounter: Payer: Self-pay | Admitting: Cardiology

## 2020-07-20 ENCOUNTER — Ambulatory Visit: Payer: 59 | Admitting: Cardiology

## 2020-07-20 ENCOUNTER — Other Ambulatory Visit: Payer: Self-pay | Admitting: Cardiology

## 2020-07-20 VITALS — BP 96/57 | HR 60 | Temp 97.8°F | Resp 16 | Ht 65.0 in | Wt 178.0 lb

## 2020-07-20 DIAGNOSIS — I493 Ventricular premature depolarization: Secondary | ICD-10-CM | POA: Diagnosis not present

## 2020-07-20 MED ORDER — BYSTOLIC 5 MG PO TABS: 5.0000 mg | ORAL_TABLET | Freq: Every day | ORAL | 3 refills | Status: DC

## 2020-07-20 MED FILL — PRIMIDONE 50 MG TABS: 50 | 90 days supply | Qty: 180 | Fill #0

## 2020-07-21 MED FILL — TOPIRAMATE 50 MG TABLET: 50 | 90 days supply | Qty: 90 | Fill #0

## 2020-07-24 ENCOUNTER — Other Ambulatory Visit (HOSPITAL_COMMUNITY): Payer: Self-pay | Admitting: Family Medicine

## 2020-07-25 ENCOUNTER — Other Ambulatory Visit: Payer: 59

## 2020-07-27 ENCOUNTER — Other Ambulatory Visit: Payer: Self-pay

## 2020-07-27 ENCOUNTER — Ambulatory Visit
Admission: RE | Admit: 2020-07-27 | Discharge: 2020-07-27 | Disposition: A | Discharge status: Home / Self Care | Payer: 59 | Source: Ambulatory Visit | Attending: Medical | Admitting: Medical

## 2020-07-27 DIAGNOSIS — G8929 Other chronic pain: Secondary | ICD-10-CM

## 2020-07-27 DIAGNOSIS — G43709 Chronic migraine without aura, not intractable, without status migrainosus: Secondary | ICD-10-CM | POA: Diagnosis not present

## 2020-07-27 DIAGNOSIS — R519 Headache, unspecified: Secondary | ICD-10-CM

## 2020-07-27 DIAGNOSIS — R569 Unspecified convulsions: Secondary | ICD-10-CM | POA: Diagnosis not present

## 2020-07-27 DIAGNOSIS — H748X2 Other specified disorders of left middle ear and mastoid: Secondary | ICD-10-CM | POA: Diagnosis not present

## 2020-07-27 IMAGING — MR MR HEAD W/O CM
14 series · 45 of 48 positions shown · non-contrast
Comparison: None.

CLINICAL DATA: Chronic headaches. Seizures which are controlled by
medication.

EXAM:
MRI HEAD WITHOUT CONTRAST
TECHNIQUE: Multiplanar, multiecho pulse sequences of the brain and surrounding
structures were obtained without intravenous contrast.

[Series 2: t1_se_sag · sagittal · 5.0mm · 0.45mm/px · 3 of 24 slices shown]
[im 1/24]
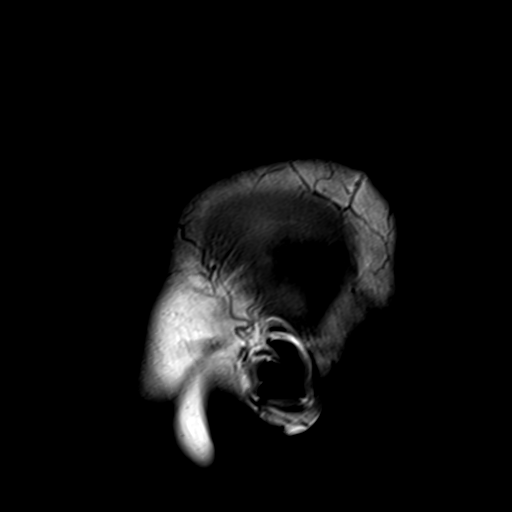
[im 12/24]
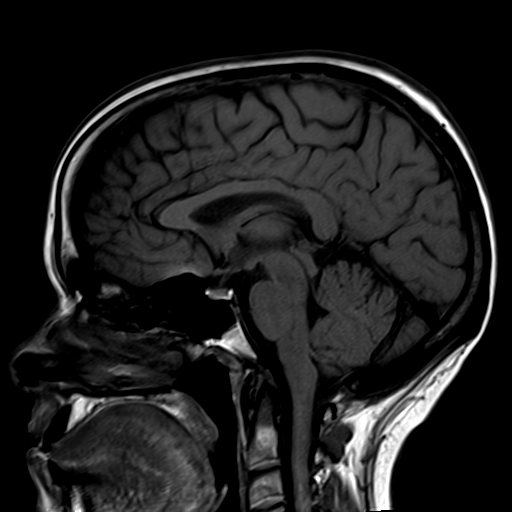
[im 24/24]
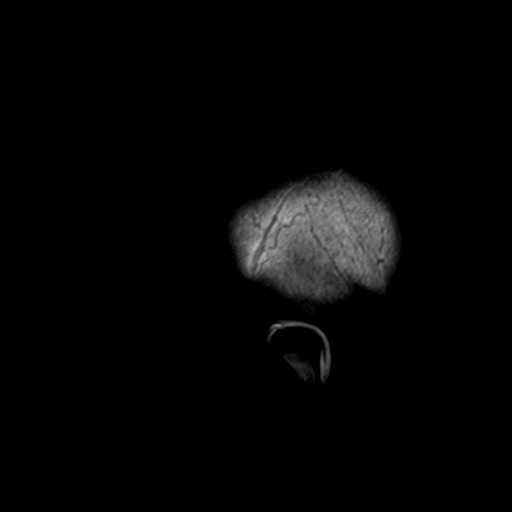

[Series 3: ep2d_diff_3 · axial · 3.0mm · 1.80mm/px · z∈[-44,+109]mm · 7 of 103 slices shown]
[im 1/103]
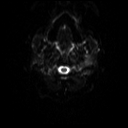
[im 18/103]
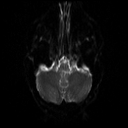
[im 35/103]
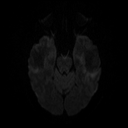
[im 52/103]
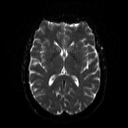
[im 69/103]
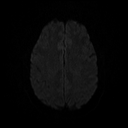
[im 86/103]
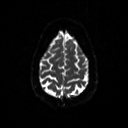
[im 103/103]
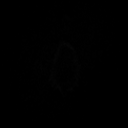

[Series 4: ep2d_diff_3_adc · axial · 3.0mm · 1.80mm/px · z∈[-44,+109]mm · 3 of 50 slices shown]
[im 1/50]
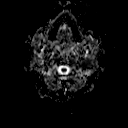
[im 25/50]
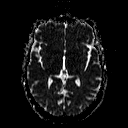
[im 50/50]
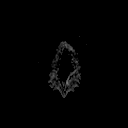

[Series 5: ep2d_diff_cor · coronal · 5.0mm · 1.77mm/px · 4 of 61 slices shown]
[im 1/61]
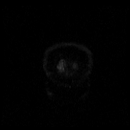
[im 21/61]
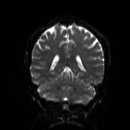
[im 41/61]
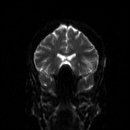
[im 61/61]
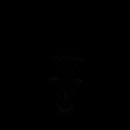

[Series 6: ep2d_diff_cor_adc · coronal · 5.0mm · 1.77mm/px · 2 of 31 slices shown]
[im 1/31]
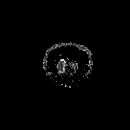
[im 31/31]
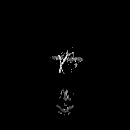

[Series 8: swi_images · axial · 2.0mm · 0.90mm/px · z∈[-47,+110]mm · 5 of 80 slices shown]
[im 1/80]
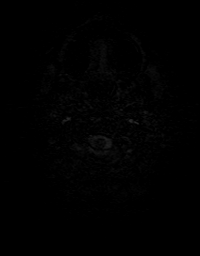
[im 20/80]
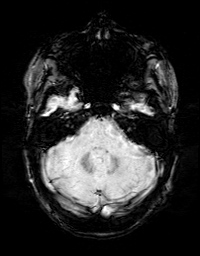
[im 40/80]
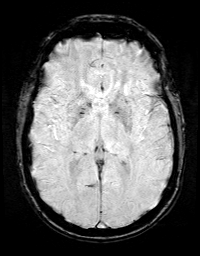
[im 60/80]
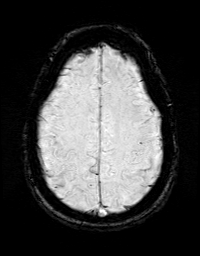
[im 80/80]
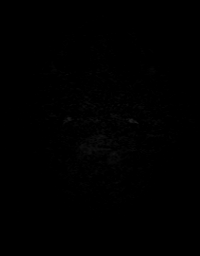

[Series 9: FLAIR · axial · 3.0mm · 0.43mm/px · z∈[-46,+110]mm · 2 of 27 slices shown (1 of 3)]
[im 1/27]
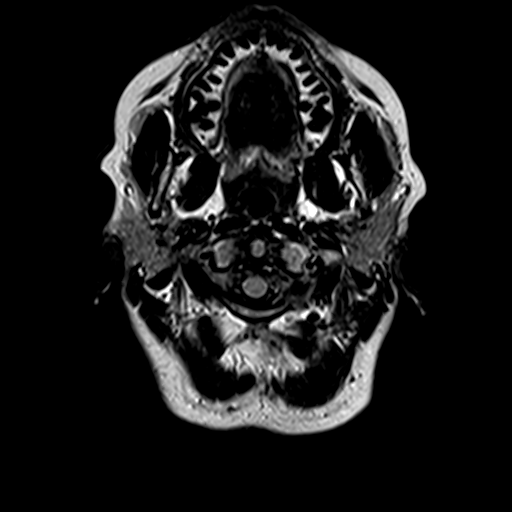
[im 27/27]
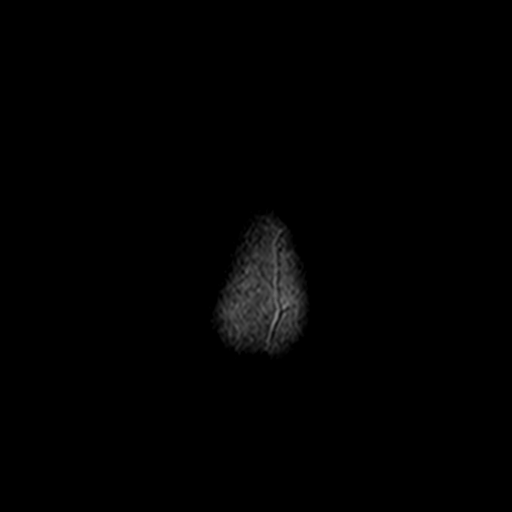

[Series 12: T2 · coronal · 5.0mm · 0.45mm/px · 2 of 32 slices shown (1 of 2)]
[im 1/32]
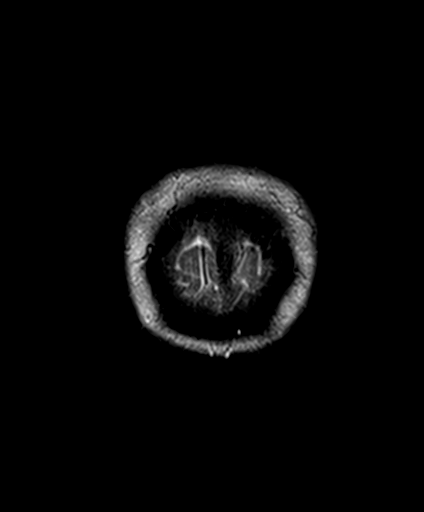
[im 32/32]
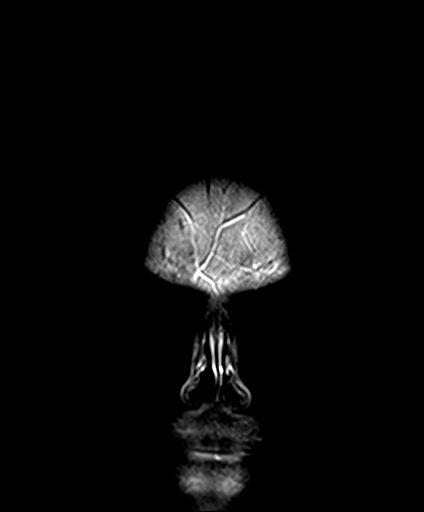

[Series 13: mpr coronal · coronal · 1.0mm · 0.72mm/px · 1 of 19 slices shown]
[im 1/19]
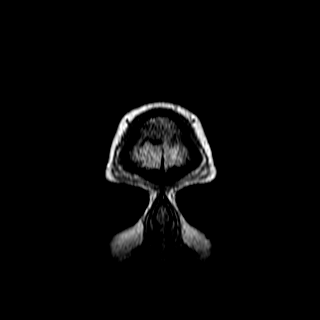

[Series 13: T2 · coronal · 3.0mm · 0.23mm/px · 2 of 28 slices shown (2 of 2)]
[im 1/28]
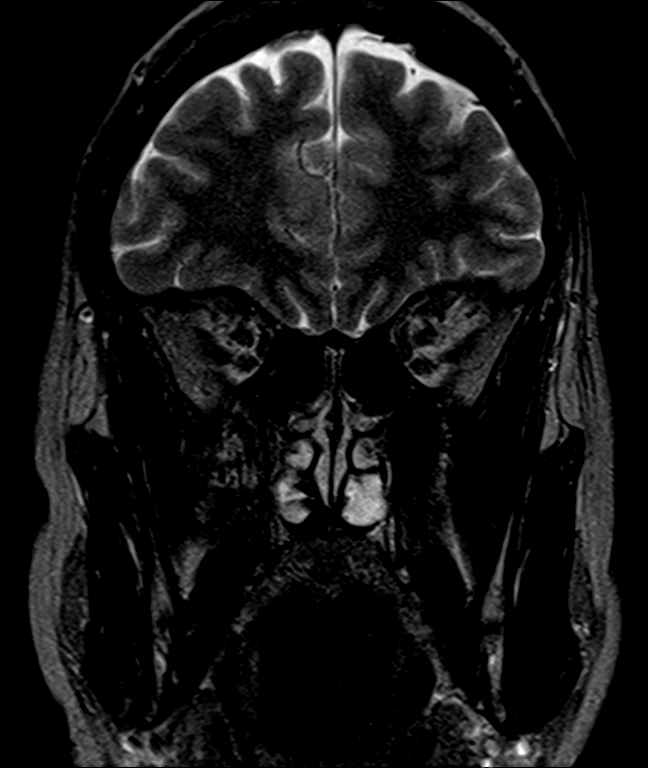
[im 28/28]
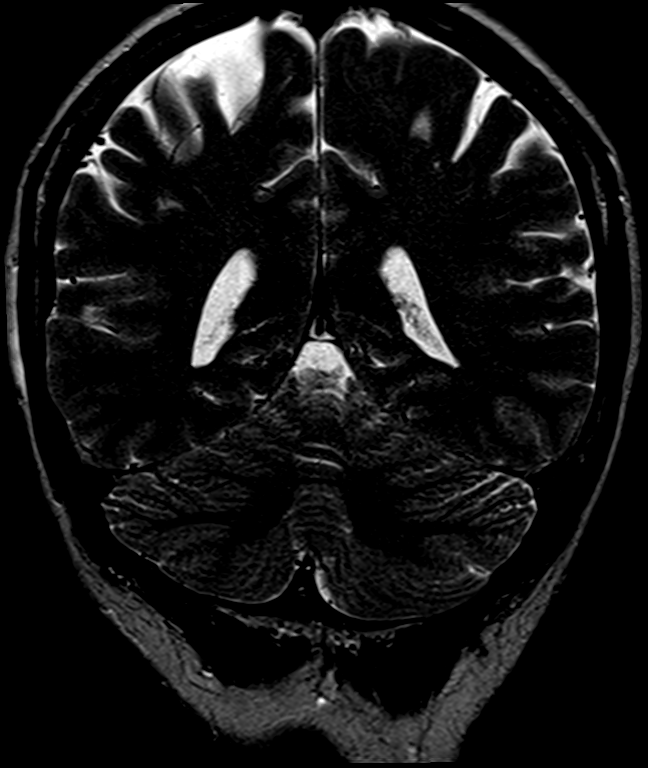

[Series 15: FLAIR · coronal · 3.0mm · 0.70mm/px · 2 of 28 slices shown (2 of 3)]
[im 1/28]
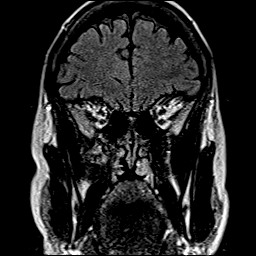
[im 28/28]
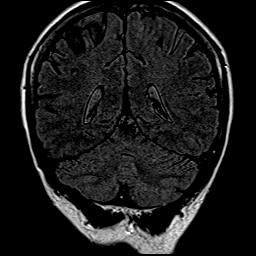

[Series 16: FLAIR · sagittal · 5.0mm · 0.45mm/px · 2 of 28 slices shown (3 of 3)]
[im 1/28]
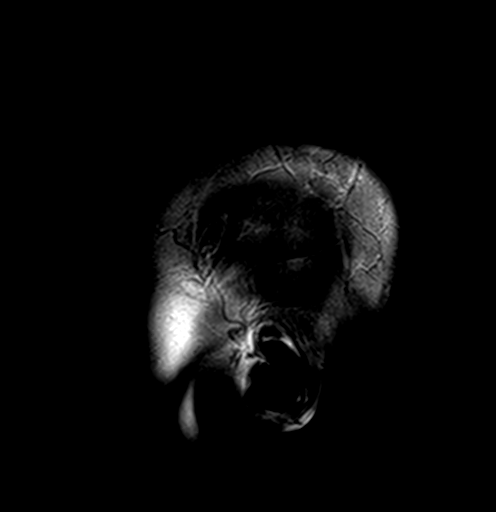
[im 28/28]
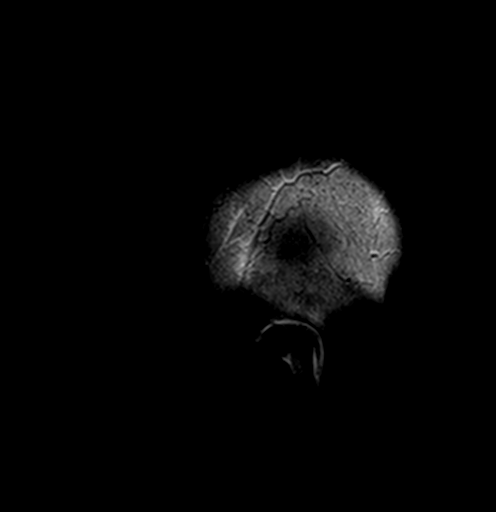

[Series 17: t2_tse repeat · axial · 5.0mm · 0.72mm/px · z∈[-62,+111]mm · 2 of 30 slices shown]
[im 1/30]
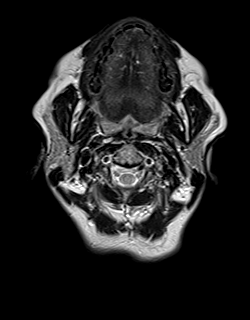
[im 30/30]
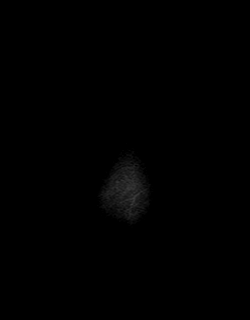

[Series 18: t1_mpr_tra repeat · axial · 1.0mm · 0.72mm/px · z∈[-59,+106]mm · 8 of 160 slices shown]
[im 1/160]
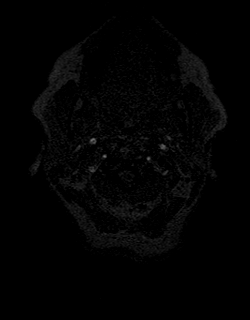
[im 32/160]
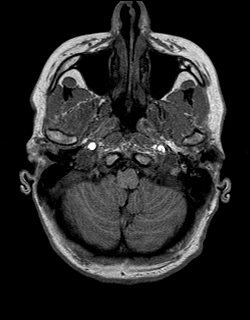
[im 48/160]
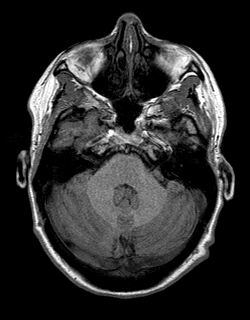
[im 64/160]
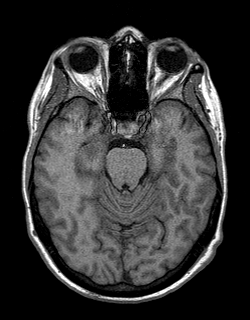
[im 96/160]
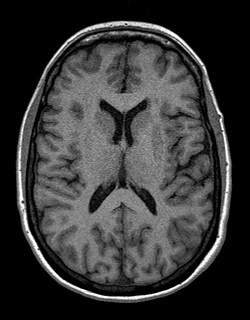
[im 112/160]
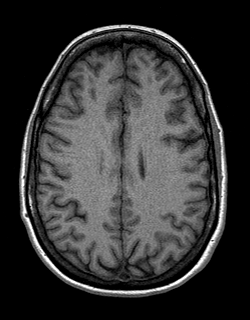
[im 128/160]
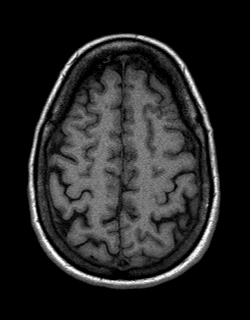
[im 160/160]
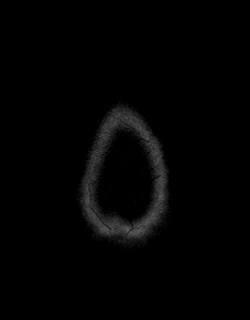

[45 of 48 positions shown; findings below may reference images not displayed]

FINDINGS: Brain: No acute infarction, hemorrhage, hydrocephalus, extra-axial
collection or mass lesion. There are scattered T2/FLAIR
hyperintensities within the frontal predominant white matter, mildly
advanced in number for age. Hippocampi are symmetric and within
normal limits.

Vascular: Major arterial flow voids are maintained at the skull
base.

Skull and upper cervical spine: Normal marrow signal.

Sinuses/Orbits: Clear sinuses.  Unremarkable orbits.

Other: Trace left mastoid effusion.
IMPRESSION: 1. No acute abnormality.
2. Nonspecific scattered T2/FLAIR hyperintensities within the white
matter, mildly advanced in number for age. Given the frontal
predominance and the provided history, these may be related to the
patient's chronic migraines. Additional differential considerations
include the sequela of chronic microvascular ischemic disease, prior
trauma, demyelination, or inflammation.

## 2020-07-28 ENCOUNTER — Other Ambulatory Visit: Payer: 59

## 2020-07-30 ENCOUNTER — Other Ambulatory Visit: Payer: Self-pay | Admitting: Medical

## 2020-07-30 MED ORDER — PRIMIDONE 50 MG PO TABS: 50.0000 mg | ORAL_TABLET | Freq: Two times a day (BID) | ORAL | 0 refills | Status: DC

## 2020-07-30 MED ORDER — TOPIRAMATE 50 MG PO TABS: 50.0000 mg | ORAL_TABLET | Freq: Two times a day (BID) | ORAL | 0 refills | Status: DC

## 2020-07-30 MED ORDER — OMEPRAZOLE 40 MG PO CPDR: 40.0000 mg | DELAYED_RELEASE_CAPSULE | Freq: Every day | ORAL | 1 refills | Status: DC

## 2020-07-31 MED FILL — OMEPRAZOLE 40 MG CPDR: 40 | 90 days supply | Qty: 90 | Fill #0

## 2020-08-01 ENCOUNTER — Other Ambulatory Visit: Payer: 59

## 2020-08-03 ENCOUNTER — Telehealth: Payer: Self-pay

## 2020-08-03 NOTE — Telephone Encounter (Signed)
P.A. Quintella Reichert BRAND ONLY

## 2020-08-19 ENCOUNTER — Other Ambulatory Visit: Payer: Self-pay

## 2020-08-19 ENCOUNTER — Ambulatory Visit: Payer: 59 | Admitting: Medical

## 2020-08-19 ENCOUNTER — Encounter: Payer: Self-pay | Admitting: Medical

## 2020-08-19 VITALS — BP 110/72 | HR 79 | Ht 65.0 in | Wt 177.8 lb

## 2020-08-19 DIAGNOSIS — R635 Abnormal weight gain: Secondary | ICD-10-CM | POA: Diagnosis not present

## 2020-08-19 DIAGNOSIS — R519 Headache, unspecified: Secondary | ICD-10-CM | POA: Diagnosis not present

## 2020-08-19 DIAGNOSIS — M79672 Pain in left foot: Secondary | ICD-10-CM

## 2020-08-19 DIAGNOSIS — Z818 Family history of other mental and behavioral disorders: Secondary | ICD-10-CM

## 2020-08-19 DIAGNOSIS — Z9884 Bariatric surgery status: Secondary | ICD-10-CM | POA: Diagnosis not present

## 2020-08-19 DIAGNOSIS — I493 Ventricular premature depolarization: Secondary | ICD-10-CM | POA: Diagnosis not present

## 2020-08-19 DIAGNOSIS — G250 Essential tremor: Secondary | ICD-10-CM | POA: Diagnosis not present

## 2020-08-19 DIAGNOSIS — G40909 Epilepsy, unspecified, not intractable, without status epilepticus: Secondary | ICD-10-CM | POA: Diagnosis not present

## 2020-08-19 DIAGNOSIS — R0683 Snoring: Secondary | ICD-10-CM

## 2020-08-19 DIAGNOSIS — M79671 Pain in right foot: Secondary | ICD-10-CM | POA: Insufficient documentation

## 2020-08-19 DIAGNOSIS — R002 Palpitations: Secondary | ICD-10-CM | POA: Diagnosis not present

## 2020-08-19 DIAGNOSIS — R252 Cramp and spasm: Secondary | ICD-10-CM | POA: Insufficient documentation

## 2020-08-19 DIAGNOSIS — R0989 Other specified symptoms and signs involving the circulatory and respiratory systems: Secondary | ICD-10-CM | POA: Insufficient documentation

## 2020-08-19 NOTE — Patient Instructions (Addendum)
Massage Therapy:  Sharen Hint [facility] [address] [place] [address] [place], [place] [address] [phone] [email or web address]   I reviewed her last visit which is a new patient established visit.  We reviewed the recent MRI results of her brain.  Headaches-I suspect tension headaches given the characteristics of her headaches.  She also saw her eye doctor recently and is waiting on her new glasses which may help headaches.  She does have an establish care visit with neurology here in [place] but it is about a month away.  I advise she consider getting massage therapy, we discussed starting an exercise and stretching program, discussed decreasing stress where possible.    We did discuss possibly going up on Topamax.  She takes this for seizure prevention but this could also help with weight loss and headaches.  She will think about it and let me know  PVC, palpitations-she saw cardiology recently as we referred to local cardiologist.  She seems to do better on name brand Bystolic and not the generic.  No other specific concerns in this regard and doing okay for now.  No other work-up planned at this time.  I reviewed her January 24 notes from cardiology  Weight gain-she reports eating healthy and drinking plenty of water.  We discussed some strategies with exercise.  We also discussed possibly considering weight loss medicines like Wegovy injection or increasing Topamax dose or possibly half dose of phentermine short-term.  She will consider these recommendations and let me know as well.  We will check a thyroid lab today.  Essential tremor-does fine on current medication primidone  Foot pain, leg cramping-no recent electrolyte abnormality.  We discussed possibly doing ABIs given slightly decreased pulses and purplish feet that she reports at times.  I advise she try using a good supportive shoe and not going barefooted and not using flats for now.  Again she will let me know  about the ABI blood flow screen recommendation  Family history of dementia-she will discuss her MRI of her brain, headaches in memory concerns with neurology when she sees them soon.  She seems to be doing fine from a neurological standpoint and cognition standpoint but she will discuss further with neurology

## 2020-08-19 NOTE — Progress Notes (Signed)
Subjective:     Patient ID: Latasha Ramirez, female   DOB: [birth year], 50 y.o.   MRN: [id number]   HPI Chief Complaint  Patient presents with  . Follow-up    Recent MRI done,  . Weight Gain    Gained 20 pounds in the last year    Medical team:  Neurology in the past, [facility], has appointment coming up soon with neurology here locally in [facility]  Cardiology in the past, [facility], recently saw Dr. Truett Mainland cardiology here locally  GI in the past, [facility]  Dr. Genia Harold, GI with [facility]  Here for 1 month follow-up.  I saw her as a new patient in January.  We sent her for MRI brain.  She is here to discuss those results  Since last visit she saw her eye doctor and has new glasses coming in a few weeks.  Her new patient neurology appointment is coming up at the end of March.  She recently saw cardiology for palpitations.  They agree that as long she stays on name brand Bystolic her palpitation is fine but she does not tolerate the generic  She continues to report cramps and pains in her legs and feet.  She wears a mixture of tennis shoes and flat sandals  She drinks a lot of water throughout the day  She is doing some walking with exercise, occasional weights  She is frustrated that she is gaining weight despite eating a very healthy diet and drinking lots of water.  She has a history of gastric bypass surgery.  Regarding the MRI of the brain she is concerned about dementia given the several grandparents and uncles and aunts with dementia in the family  Overall she feels her memory is good but occasionally she loses things and feels like her math skills are not as good as they were years ago    History from last visit: New patient establishing care today.  Was working for [employer] but just recently joined [employer].  Been here in [place] 1.5 years.  Recently started with [facility] [facility].  Work site Field seismologist.    Initially made appt due to headache and neck pain.  Been waking up with headaches and neck pain for a few months.  Has been seeing chiropractor, has improved ROM but still having pain and headaches.   Feels like she has a pain and fullness in back of head.  Headaches last a few hours but gone by lunch.   Most of the time awakes with them, but sometimes occasional afternoon headache.  occasionally gets numbness in 4th and 5th fingers bilat.  No loss of vision or hearing.  No prior diagnoses of headache disorder.  No headache with sex.   Not exercising.   Sexual desire is gone on the primidone.   Takes systolic for PVCs, been on this since 2014.  Vision is changing.   Hasn't seen eye doctor since 08/2018.    Has discomfort in right ear/face.  In the past gets pain in right ear if earache or infection.    2 nights ago had some chest discomfort.   yesterday morning felt same, after walking from car to work, felt tired and weak.   Felt SOB.   Had EKG done at her office, went to ED was seen in emergency dept.  troponin and EKG fine other than PVCs.  Had + d-dimer, had negative chest CT.  Then sent her home.   Still feels  fatigue, and can still feel the PVC or palpitations.    In December when they filled her prescription of bystolic was switched to generic not the name brand.  Has had problems with the bystolic generic in the past.    This week her colitis has flared up.  No current fever, no blood in stool, no abdominal pain.   Has no prior dx of IBS.  Had colonoscopy 09/2019 with [facility], Dr. Genia Harold.  Diagnosed with colitis.  Has had gastric bypass 2005.     Uses Primidone for essential tremor.   Lives with partner.  Snores some.  Most of the time awakes rested.   Sometimes gets daytime somnolence.  No prior sleep study.   hasn't used lorazepam in a while.  Was on it prior more frequency but had very stressful job with [employer] [place].      Past Medical History:  Diagnosis Date  .  Anxiety   . GERD (gastroesophageal reflux disease)   . History of bleeding ulcers   . Hypotension   . Seizures ([facility])    last seizure 2010.  last EEG 2015   Current Outpatient Medications on File Prior to Visit  Medication Sig Dispense Refill  . BYSTOLIC 5 MG tablet Take 1 tablet (5 mg total) by mouth daily. No generic nebivolol please 90 tablet 3  . LORazepam (ATIVAN) 0.5 MG tablet Take 1 tablet (0.5 mg total) by mouth 2 (two) times daily as needed for anxiety. 30 tablet 1  . omeprazole (PRILOSEC) 40 MG capsule Take 1 capsule (40 mg total) by mouth daily. 90 capsule 1  . primidone (MYSOLINE) 50 MG tablet Take 1 tablet (50 mg total) by mouth 2 (two) times daily. 180 tablet 0  . topiramate (TOPAMAX) 50 MG tablet Take 1 tablet (50 mg total) by mouth 2 (two) times daily. 180 tablet 0   No current facility-administered medications on file prior to visit.     Review of Systems As in subjective       Objective:   Physical Exam Due to coronavirus pandemic stay at home measures, patient visit was virtual and they were not examined in person.   BP 110/72   Pulse 79   Ht 5\' 5"  (1.651 m)   Wt 177 lb 12.8 oz (80.6 kg)   SpO2 99%   BMI 29.59 kg/m     General appearence: alert, no distress, WD/WN, white female  Extremities -  No lower extremity edema, 1+ posterior tibialis pulse, somewhat decreased dorsal pedal pulse bilaterally, slight pinkish-purple color of toes but normal cap refill MSK: Decent foot arches but when she stands her feet do flatten out a little, no obvious tenderness of her feet no swelling no deformity otherwise, legs nontender to palpation no asymmetry of calves Neurological: alert, oriented x 3, CN2-12 intact, strength normal upper extremities and lower extremities, sensation normal throughout, DTRs 2+ throughout, no cerebellar signs, gait normal Psychiatric: normal affect, behavior normal, pleasant    MRI brain 07/27/2020 IMPRESSION: 1. No acute abnormality. 2.  Nonspecific scattered T2/FLAIR hyperintensities within the white matter, mildly advanced in number for age. Given the frontal predominance and the provided history, these may be related to the patient's chronic migraines. Additional differential considerations include the sequela of chronic microvascular ischemic disease, prior trauma, demyelination, or inflammation.     Assessment:     Encounter Diagnoses  Name Primary?  07/29/2020 Nonintractable headache, unspecified chronicity pattern, unspecified headache type Yes  . Weight gain   .  PVC (premature ventricular contraction)   . Essential tremor   . Seizure disorder ([facility])   . H/O gastric bypass   . Palpitations   . Snoring   . Leg cramping   . Foot pain, bilateral   . Family history of dementia   . Decreased pulses in feet        Plan:     I reviewed her last visit which is a new patient established visit.  We reviewed the recent MRI results of her brain.  Headaches-I suspect tension headaches given the characteristics of her headaches.  She also saw her eye doctor recently and is waiting on her new glasses which may help headaches.  She does have an establish care visit with neurology here in [place] but it is about a month away.  I advise she consider getting massage therapy, we discussed starting an exercise and stretching program, discussed decreasing stress where possible.    We did discuss possibly going up on Topamax.  She takes this for seizure prevention but this could also help with weight loss and headaches.  She will think about it and let me know  PVC, palpitations-she saw cardiology recently as we referred to local cardiologist.  She seems to do better on name brand Bystolic and not the generic.  No other specific concerns in this regard and doing okay for now.  No other work-up planned at this time.  I reviewed her January 24 notes from cardiology  Weight gain-she reports eating healthy and drinking plenty of water.  We  discussed some strategies with exercise.  We also discussed possibly considering weight loss medicines like Wegovy injection or increasing Topamax dose or possibly half dose of phentermine short-term.  She will consider these recommendations and let me know as well.  We will check a thyroid lab today.  Essential tremor-does fine on current medication primidone  Foot pain, leg cramping-no recent electrolyte abnormality.  We discussed possibly doing ABIs given slightly decreased pulses and purplish feet that she reports at times.  I advise she try using a good supportive shoe and not going barefooted and not using flats for now.  Again she will let me know about the ABI blood flow screen recommendation  Family history of dementia-she will discuss her MRI of her brain, headaches in memory concerns with neurology when she sees them soon.  She seems to be doing fine from a neurological standpoint and cognition standpoint but she will discuss further with neurology   Kamrie was seen today for follow-up and weight gain.  Diagnoses and all orders for this visit:  Nonintractable headache, unspecified chronicity pattern, unspecified headache type  Weight gain -     TSH  PVC (premature ventricular contraction)  Essential tremor  Seizure disorder (HCC)  H/O gastric bypass  Palpitations  Snoring  Leg cramping  Foot pain, bilateral  Family history of dementia  Decreased pulses in feet  f/u pending neurology consult

## 2020-08-20 LAB — TSH: TSH: 1.3 u[IU]/mL (ref 0.450–4.500)

## 2020-08-27 ENCOUNTER — Other Ambulatory Visit: Payer: Self-pay | Admitting: Cardiology

## 2020-08-27 ENCOUNTER — Other Ambulatory Visit: Payer: Self-pay

## 2020-08-27 DIAGNOSIS — I493 Ventricular premature depolarization: Secondary | ICD-10-CM

## 2020-08-27 MED ORDER — BYSTOLIC 5 MG PO TABS: 5.0000 mg | ORAL_TABLET | Freq: Every day | ORAL | 3 refills | Status: DC

## 2020-08-27 MED FILL — BYSTOLIC 5 MG TABLET: 5 | 5 days supply | Qty: 5 | Fill #0

## 2020-08-31 ENCOUNTER — Other Ambulatory Visit: Payer: Self-pay

## 2020-08-31 ENCOUNTER — Other Ambulatory Visit: Payer: Self-pay | Admitting: Cardiology

## 2020-08-31 DIAGNOSIS — I493 Ventricular premature depolarization: Secondary | ICD-10-CM

## 2020-08-31 MED ORDER — BYSTOLIC 5 MG PO TABS: 5.0000 mg | ORAL_TABLET | Freq: Every day | ORAL | 3 refills | Status: DC

## 2020-08-31 MED FILL — BYSTOLIC 5 MG TABLET: 5 | 30 days supply | Qty: 30 | Fill #0

## 2020-09-03 ENCOUNTER — Telehealth: Payer: Self-pay

## 2020-09-03 NOTE — Telephone Encounter (Signed)
Test message

## 2020-09-03 NOTE — Telephone Encounter (Signed)
Telephone encounter:  Reason for call: pt's insurance has denied Bystolic  Usual provider: [facility]  Last office visit: 07/20/20  Next office visit:    Last hospitalization:  Current Outpatient Medications on File Prior to Visit  Medication Sig Dispense Refill  . BYSTOLIC 5 MG tablet Take 1 tablet (5 mg total) by mouth daily. No generic nebivolol please 90 tablet 3  . LORazepam (ATIVAN) 0.5 MG tablet Take 1 tablet (0.5 mg total) by mouth 2 (two) times daily as needed for anxiety. 30 tablet 1  . omeprazole (PRILOSEC) 40 MG capsule Take 1 capsule (40 mg total) by mouth daily. 90 capsule 1  . primidone (MYSOLINE) 50 MG tablet Take 1 tablet (50 mg total) by mouth 2 (two) times daily. 180 tablet 0  . topiramate (TOPAMAX) 50 MG tablet Take 1 tablet (50 mg total) by mouth 2 (two) times daily. 180 tablet 0   No current facility-administered medications on file prior to visit.

## 2020-09-04 NOTE — Telephone Encounter (Signed)
Formulary exemption requested. Will complete the appeal request and fax over the justification. Prior Auth Reference Number: [id number]

## 2020-09-07 ENCOUNTER — Other Ambulatory Visit: Payer: Self-pay | Admitting: Medical

## 2020-09-11 NOTE — Telephone Encounter (Signed)
Other prescribing office handling denial and appeal

## 2020-09-22 ENCOUNTER — Other Ambulatory Visit: Payer: Self-pay

## 2020-09-22 DIAGNOSIS — I493 Ventricular premature depolarization: Secondary | ICD-10-CM

## 2020-09-23 ENCOUNTER — Other Ambulatory Visit: Payer: Self-pay

## 2020-09-23 ENCOUNTER — Ambulatory Visit: Payer: 59 | Admitting: Neurology

## 2020-09-23 ENCOUNTER — Other Ambulatory Visit: Payer: Self-pay | Admitting: Neurology

## 2020-09-23 ENCOUNTER — Encounter: Payer: Self-pay | Admitting: Neurology

## 2020-09-23 VITALS — BP 91/67 | HR 84 | Ht 65.0 in | Wt 177.8 lb

## 2020-09-23 DIAGNOSIS — Z87898 Personal history of other specified conditions: Secondary | ICD-10-CM | POA: Diagnosis not present

## 2020-09-23 DIAGNOSIS — G250 Essential tremor: Secondary | ICD-10-CM

## 2020-09-23 DIAGNOSIS — G4486 Cervicogenic headache: Secondary | ICD-10-CM

## 2020-09-23 MED ORDER — TOPIRAMATE 50 MG PO TABS: ORAL_TABLET | ORAL | 3 refills | Status: DC

## 2020-09-23 NOTE — Progress Notes (Signed)
NEUROLOGY CONSULTATION NOTE  Latasha Ramirez MRN: [id number] DOB: 09-Aug-1970  Referring provider: Dr. Alvester Chou (ER) Primary care provider: Crosby Oyster, PA-C  Reason for consult:  seizure  Dear Dr Renaye Rakers:  Thank you for your kind referral of Latasha Ramirez for consultation of the above symptoms. Although her history is well known to you, please allow me to reiterate it for the purpose of our medical record. Records and images were personally reviewed where available. Records from her prior neurologist Dr. Macon Large were reviewed,where her last visit was in 2017.  HISTORY OF PRESENT ILLNESS: This is a 50 year old right-handed woman with a history of hypotension, gastric bypass, essential tremor, and seizures, presenting to establish care.  1. Seizures. The first seizure occurred at age 44, it was unwitnessed with no prior warning symptoms, she woke up on the bathroom floor with drool on her arm. She denies any tongue bite or incontinence (prior neurology notes indicate there was tongue bite and incontinence with her first GTC in July 2011). She was started on Levetiracetam then switched to Topiramate in 2018 due to side effects of feeling "zombiefied." Prior to the GTC, she was having recurrent syncopal episodes where she would lose consciousness for 15 seconds, attributed to hypotension. They would usually occur when taking a shower or getting up in the morning. She has had 4 episodes, last occurred 1.5 years ago while taking a hot shower, her hearing went and she had tunnel vision, she called for her husband and he lowered her to the floor. As soon as she went out, she woke up, no post-event confusion. She denies any staring/unresponsive episodes, olfactory/gustatory hallucinations, myoclonic jerks. She woke up last Saturday with numbness in her left 2 fingers which took several hours to feel normal. A maternal aunt and paternal cousin have seizures. Otherwise she had a normal birth and early  development, no history of febrile convulsions, CNS infections, neurosurgical procedures, significant head injuries.   2. She has benign essential tremor, with family history of tremors in at least 2 maternal cousins. She has had tremors her whole life, more noticeable on the right hand. She notices it when carrying something, it occasionally affects handwriting. It does not affect eating/drinking or affect work. She was started on Primidone in 2016, taking 100mg  daily with good response. She feels it has affected her libido for the past 3-4 years.  3. Headaches. She has had headaches for several years with pain in the back of her head. No associated nausea/vomiting, photo/phonophobia. Headaches occur at least once a week, she takes Ibuprofen or Tylenol. She notices more pain when she wakes up in the morning, back of head "is just awful," with 5-6 over 10 pain, going down to a 1-2 with increase in Topiramate to 50mg  in AM, 100mg  in PM. She was having neck pain, she saw a chiropractor which has helped some with headaches, neck pain somewhat better. She has stretching exercises.   4. Memory loss. She is concerned about dementia, she was a major in college but now would play dominoes and have a hard time adding up scores. She has word-finding difficulties. She denies getting lost driving or missing medications. Bills are on autopay. There is a history of dementia on her father's side.    Prior AEDs: Levetiracetam Laboratory Data:  EEGs: Done at [facility] in [place] - normal wake and sleep EEG in 2019, 2017. Per notes, "a previous EEG (not available for review) was abnormal" MRI: MRI without contrast  done 2011 in [place], [place] reported as normal.   PAST MEDICAL HISTORY: Past Medical History:  Diagnosis Date  . Anxiety   . GERD (gastroesophageal reflux disease)   . History of bleeding ulcers   . Hypotension   . Seizures (HCC)    last seizure 2010.  last EEG 2015    PAST  SURGICAL HISTORY: Past Surgical History:  Procedure Laterality Date  . CESAREAN SECTION    . CHOLECYSTECTOMY    . ESOPHAGOGASTRODUODENOSCOPY    . GASTRIC BYPASS      MEDICATIONS: Current Outpatient Medications on File Prior to Visit  Medication Sig Dispense Refill  . BYSTOLIC 5 MG tablet Take 1 tablet (5 mg total) by mouth daily. No generic nebivolol please 90 tablet 3  . LORazepam (ATIVAN) 0.5 MG tablet Take 1 tablet (0.5 mg total) by mouth 2 (two) times daily as needed for anxiety. 30 tablet 1  . omeprazole (PRILOSEC) 40 MG capsule Take 1 capsule (40 mg total) by mouth daily. 90 capsule 1  . primidone (MYSOLINE) 50 MG tablet Take 1 tablet (50 mg total) by mouth 2 (two) times daily. 180 tablet 0  . topiramate (TOPAMAX) 50 MG tablet Take 1 tablet (50 mg total) by mouth 2 (two) times daily. (Patient taking differently: Take 50 mg by mouth 2 (two) times daily. One 50 mg in the morning and two 50 mg tabs at night) 180 tablet 0   No current facility-administered medications on file prior to visit.    ALLERGIES: Allergies  Allergen Reactions  . Other Rash    Foam tape cause blisters / all adhesives cause contact rash   . Adhesive [Tape]   . Flexeril [Cyclobenzaprine]     Palpitations on Flexeril and Skelaxin   . Levaquin [Levofloxacin]     Diarrhea, GI upset  . Zoloft [Sertraline Hcl]     Heart palpitations, PVCs.  Including adverse reaction to zoloft, paxil, lexapro  . Sulfa Antibiotics Rash    FAMILY HISTORY: Family History  Problem Relation Age of Onset  . Ovarian cancer Mother   . Cancer Mother 23       ovarian  . Heart disease Father 89       died with MI  . Diabetes Father   . Diabetes Maternal Grandmother   . Suicidality Maternal Grandfather   . Diabetes Paternal Grandmother   . Skin cancer Paternal Grandfather     SOCIAL HISTORY: Social History   Socioeconomic History  . Marital status: Divorced    Spouse name: Not on file  . Number of children: 1  .  Years of education: Not on file  . Highest education level: Not on file  Occupational History  . Not on file  Tobacco Use  . Smoking status: Never Smoker  . Smokeless tobacco: Never Used  Vaping Use  . Vaping Use: Never used  Substance and Sexual Activity  . Alcohol use: Yes    Comment: socially, rarely  . Drug use: Never  . Sexual activity: Not on file  Other Topics Concern  . Not on file  Social History Narrative   Right handed    Lives with family    Social Determinants of Health   Financial Resource Strain: Not on file  Food Insecurity: Not on file  Transportation Needs: Not on file  Physical Activity: Not on file  Stress: Not on file  Social Connections: Not on file  Intimate Partner Violence: Not on file     PHYSICAL EXAM: Vitals:  09/23/20 0905  BP: 91/67  Pulse: 84  SpO2: 99%   General: No acute distress Head:  Normocephalic/atraumatic Skin/Extremities: No rash, no edema Neurological Exam: Mental status: alert and oriented to person, place, and time, no dysarthria or aphasia, Fund of knowledge is appropriate.  Recent and remote memory are intact.  Attention and concentration are normal.    Able to name objects and repeat phrases. Cranial nerves: CN I: not tested CN II: pupils equal, round and reactive to light, visual fields intact CN III, IV, VI:  full range of motion, no nystagmus, no ptosis CN V: decreased pin and cold on left V2 CN VII: upper and lower face symmetric CN VIII: hearing intact to conversation Bulk & Tone: normal, no fasciculations, no cogwheeling. Motor: 5/5 throughout with no pronator drift. Sensation: Decreased pin and cold on left UE and LE. Romberg test negative Deep Tendon Reflexes: +2 throughout Cerebellar: no incoordination on finger to nose testing Gait: narrow-based and steady, able to tandem walk adequately. Tremor: no resting tremor, +high frequency low amplitude postural tremor R>L, minimal endpoint tremor Good finger  and foot taps    IMPRESSION: This is a pleasant 50 year old right-handed hypotension, gastric bypass, essential tremor, seizures, presenting to establish care. She denies any GTCs since 2011, she has a history of vasovagal syncope, last episode was 1.5 years ago. She is on Topiramate 50mg  in AM, 100mg  in PM, dose recently increased for headache prophylaxis. She is also on Primidone 100mg  daily for essential tremors which helps, however causing sexual side effects (decreased libido). She will try reducing Primidone to 50mg  qhs. We discussed how Topiramate can also help with tremors, however this is likely the cause of her cognitive symptoms, so we agreed to continue on current dose for now. She will send an update on symptoms in a month. She is aware of [place] driving laws to stop driving after a seizure until 6 months seizure-free. Follow-up in 4 months, call for any changes.    Thank you for allowing me to participate in the care of this patient. Please do not hesitate to call for any questions or concerns.   , M.D.  CC: , PA-C

## 2020-09-23 NOTE — Progress Notes (Signed)
Wants to ask about primidone and her sex drive?

## 2020-09-23 NOTE — Patient Instructions (Signed)
1. Reduce Primidone 50mg : Take 1 tablet every night  2. Continue Topamax 50mg : Take 1 tablet in AM, 2 tablets in PM  3. Send me an update on how you are feeling in a month  4. Follow-up in 4 months, call for any changes   Seizure Precautions: 1. If medication has been prescribed for you to prevent seizures, take it exactly as directed.  Do not stop taking the medicine without talking to your doctor first, even if you have not had a seizure in a long time.   2. Avoid activities in which a seizure would cause danger to yourself or to others.  Don't operate dangerous machinery, swim alone, or climb in high or dangerous places, such as on ladders, roofs, or girders.  Do not drive unless your doctor says you may.  3. If you have any warning that you may have a seizure, lay down in a safe place where you can't hurt yourself.    4.  No driving for 6 months from last seizure, as per [facility].   Please refer to the following link on the [employer]ile[employer] of [employer]'s website for more information: http://www.epilepsyfoundation.org/answerplace/Social/driving/drivingu.cfm   5.  Maintain good sleep hygiene. Avoid alcohol.  6.  Notify your neurology if you are planning pregnancy or if you become pregnant.  7.  Contact your doctor if you have any problems that may be related to the medicine you are taking.  8.  Call 911 and bring the patient back to the ED if:        A.  The seizure lasts longer than 5 minutes.       B.  The patient doesn't awaken shortly after the seizure  C.  The patient has new problems such as difficulty seeing, speaking or moving  D.  The patient was injured during the seizure  E.  The patient has a temperature over 102 F (39C)  F.  The patient vomited and now is having trouble breathing

## 2020-09-24 ENCOUNTER — Other Ambulatory Visit: Payer: Self-pay

## 2020-09-24 DIAGNOSIS — I493 Ventricular premature depolarization: Secondary | ICD-10-CM

## 2020-09-28 ENCOUNTER — Encounter: Payer: Self-pay | Admitting: Medical

## 2020-09-28 ENCOUNTER — Other Ambulatory Visit (HOSPITAL_COMMUNITY): Payer: Self-pay

## 2020-09-28 ENCOUNTER — Other Ambulatory Visit: Payer: Self-pay

## 2020-09-28 ENCOUNTER — Ambulatory Visit: Payer: 59 | Admitting: Medical

## 2020-09-28 VITALS — BP 90/60 | HR 65 | Ht 65.0 in | Wt 175.4 lb

## 2020-09-28 DIAGNOSIS — Z87898 Personal history of other specified conditions: Secondary | ICD-10-CM | POA: Diagnosis not present

## 2020-09-28 DIAGNOSIS — Z124 Encounter for screening for malignant neoplasm of cervix: Secondary | ICD-10-CM

## 2020-09-28 DIAGNOSIS — Z9884 Bariatric surgery status: Secondary | ICD-10-CM | POA: Diagnosis not present

## 2020-09-28 DIAGNOSIS — G8929 Other chronic pain: Secondary | ICD-10-CM

## 2020-09-28 DIAGNOSIS — Z6829 Body mass index (BMI) 29.0-29.9, adult: Secondary | ICD-10-CM | POA: Diagnosis not present

## 2020-09-28 DIAGNOSIS — G250 Essential tremor: Secondary | ICD-10-CM

## 2020-09-28 DIAGNOSIS — R519 Headache, unspecified: Secondary | ICD-10-CM | POA: Diagnosis not present

## 2020-09-28 DIAGNOSIS — Z1211 Encounter for screening for malignant neoplasm of colon: Secondary | ICD-10-CM

## 2020-09-28 MED ORDER — PHENTERMINE HCL 37.5 MG PO TABS
37.5000 mg | ORAL_TABLET | Freq: Every day | ORAL | 1 refills | Status: DC
  Filled 2020-09-28: qty 30, 30d supply, fill #0
  Filled 2020-10-31: qty 30, 30d supply, fill #1

## 2020-09-28 MED FILL — Nebivolol HCl Tab 5 MG (Base Equivalent): ORAL | 90 days supply | Qty: 90 | Fill #0 | Status: CN

## 2020-09-28 NOTE — Patient Instructions (Signed)
Check insurance coverage for other weight loss medications: Saxenda [facility]

## 2020-09-28 NOTE — Progress Notes (Signed)
Subjective: Chief Complaint  Patient presents with  . Follow-up    Pt present to follow up on medications    Here for f/u   Since last visit got in to see neurology for headaches, Dr. Karel Jarvis.  Her primidone was decreased, and Topamax was decreased.   Had been on both 5- 6 years. Neurology felt like her cognitive concerns and foggy feeling were related to side effects from those medicaiton.  Doses were decreased.    She notes she is still working to lose weight.   Doing Yoga through [facility]/webex classes.  She is working from home, so the Yoga classes online are helpful.  Been doing 3-4 per weeks.  Tries to step away from computer and walk 20-30 minutes twice daily during the day.  Gets some weight bearing exercise 2 times per week, using resistance bands.  No family history of osteoporosis.   Eating habits - doesn't eat a lot of carbs.  Typically eats 3-4 small meals daily.  Has hx/o gastric bypass.  Joined produce box to get more fruits and vegetables.    Interested in weight loss medicaiton.  Years ago tried phentermine.  Did ok on this in past.  Wants to look at options.   Past Medical History:  Diagnosis Date  . Anxiety   . GERD (gastroesophageal reflux disease)   . History of bleeding ulcers   . Hypotension   . Seizures (HCC)    last seizure 2010.  last EEG 2015   Current Outpatient Medications on File Prior to Visit  Medication Sig Dispense Refill  . BYSTOLIC 5 MG tablet TAKE 1 TABLET (5 MG TOTAL) BY MOUTH DAILY. 90 tablet 3  . omeprazole (PRILOSEC) 40 MG capsule TAKE 1 CAPSULE (40 MG TOTAL) BY MOUTH DAILY. 90 capsule 1  . primidone (MYSOLINE) 50 MG tablet TAKE 1 TABLET (50 MG TOTAL) BY MOUTH 2 (TWO) TIMES DAILY. (Patient taking differently: Take 50 mg by mouth at bedtime.) 180 tablet 0  . topiramate (TOPAMAX) 50 MG tablet TAKE 1 TABLET BY MOUTH IN THE MORNING & 2 TABS IN THE EVENING 270 tablet 3  . LORazepam (ATIVAN) 0.5 MG tablet Take 1 tablet (0.5 mg total) by mouth 2  (two) times daily as needed for anxiety. (Patient not taking: Reported on 09/28/2020) 30 tablet 1  . primidone (MYSOLINE) 50 MG tablet TAKE 2 TABLETS BY MOUTH AT BEDTIME (Patient not taking: Reported on 09/28/2020) 180 tablet 1  . topiramate (TOPAMAX) 50 MG tablet TAKE 1 TABLET BY MOUTH ONCE DAILY. (Patient not taking: Reported on 09/28/2020) 270 tablet 0   No current facility-administered medications on file prior to visit.   Past Surgical History:  Procedure Laterality Date  . CESAREAN SECTION    . CHOLECYSTECTOMY    . ESOPHAGOGASTRODUODENOSCOPY    . GASTRIC BYPASS      ROS as in subjective   Objective: BP 90/60   Pulse 65   Ht 5\' 5"  (1.651 m)   Wt 175 lb 6.4 oz (79.6 kg)   SpO2 97%   BMI 29.19 kg/m   Wt Readings from Last 3 Encounters:  09/28/20 175 lb 6.4 oz (79.6 kg)  09/23/20 177 lb 12.8 oz (80.6 kg)  08/19/20 177 lb 12.8 oz (80.6 kg)   Gen: wd, wn, nad otherwise not examined    Assessment: Encounter Diagnoses  Name Primary?  . Chronic nonintractable headache, unspecified headache type Yes  . BMI 29.0-29.9,adult   . Screening for cervical cancer   . Screen  for colon cancer   . History of seizure   . Essential tremor   . History of gastric bypass      Plan: Chronic headache, essential tremor, hx/o seizure - recently established with neurology per our referral.  I appreciate neurology seeing her and adjusting medicaiton  We will request 11/2019 colonoscopy  Referral here locally for gyn for updated pap.   Last mammogram < 1 year ago.   BMI 29 - c/t regular exercise, healthy eating habits ,begin short term trial of phentermine.  She has hx/o gastric bypass.  discussed risks/benefits of medicaiton, discussed f/u.   Sevin was seen today for follow-up.  Diagnoses and all orders for this visit:  Chronic nonintractable headache, unspecified headache type  BMI 29.0-29.9,adult -     Ambulatory referral to Gynecology  Screening for cervical cancer  Screen  for colon cancer  History of seizure  Essential tremor  History of gastric bypass  Other orders -     phentermine (ADIPEX-P) 37.5 MG tablet; Take 1 tablet (37.5 mg total) by mouth daily before breakfast.    f/u 6-8 weeks

## 2020-09-28 NOTE — Progress Notes (Signed)
Done

## 2020-09-30 ENCOUNTER — Other Ambulatory Visit (HOSPITAL_BASED_OUTPATIENT_CLINIC_OR_DEPARTMENT_OTHER): Payer: Self-pay

## 2020-09-30 ENCOUNTER — Other Ambulatory Visit (HOSPITAL_COMMUNITY): Payer: Self-pay

## 2020-09-30 ENCOUNTER — Telehealth: Payer: Self-pay | Admitting: Medical

## 2020-09-30 MED FILL — Nebivolol HCl Tab 5 MG (Base Equivalent): ORAL | 90 days supply | Qty: 90 | Fill #0 | Status: CN

## 2020-09-30 MED FILL — Nebivolol HCl Tab 5 MG (Base Equivalent): ORAL | 90 days supply | Qty: 90 | Fill #0 | Status: AC

## 2020-09-30 NOTE — Telephone Encounter (Signed)
Requested records received from [facility]

## 2020-10-01 ENCOUNTER — Other Ambulatory Visit (HOSPITAL_COMMUNITY): Payer: Self-pay

## 2020-10-01 MED FILL — Omeprazole Cap Delayed Release 40 MG: ORAL | 90 days supply | Qty: 90 | Fill #0 | Status: AC

## 2020-10-02 ENCOUNTER — Encounter: Payer: Self-pay | Admitting: Medical

## 2020-10-02 ENCOUNTER — Other Ambulatory Visit (HOSPITAL_COMMUNITY): Payer: Self-pay

## 2020-10-30 ENCOUNTER — Other Ambulatory Visit: Payer: Self-pay

## 2020-10-30 ENCOUNTER — Encounter: Payer: Self-pay | Admitting: Medical

## 2020-10-30 ENCOUNTER — Ambulatory Visit (INDEPENDENT_AMBULATORY_CARE_PROVIDER_SITE_OTHER): Payer: 59 | Admitting: Medical

## 2020-10-30 VITALS — BP 90/62 | HR 85 | Ht 65.0 in | Wt 171.8 lb

## 2020-10-30 DIAGNOSIS — G40909 Epilepsy, unspecified, not intractable, without status epilepticus: Secondary | ICD-10-CM | POA: Diagnosis not present

## 2020-10-30 DIAGNOSIS — R232 Flushing: Secondary | ICD-10-CM | POA: Insufficient documentation

## 2020-10-30 DIAGNOSIS — Z803 Family history of malignant neoplasm of breast: Secondary | ICD-10-CM

## 2020-10-30 DIAGNOSIS — G250 Essential tremor: Secondary | ICD-10-CM | POA: Diagnosis not present

## 2020-10-30 DIAGNOSIS — Z7185 Encounter for immunization safety counseling: Secondary | ICD-10-CM | POA: Insufficient documentation

## 2020-10-30 DIAGNOSIS — E538 Deficiency of other specified B group vitamins: Secondary | ICD-10-CM

## 2020-10-30 DIAGNOSIS — Z9884 Bariatric surgery status: Secondary | ICD-10-CM | POA: Diagnosis not present

## 2020-10-30 DIAGNOSIS — Z8041 Family history of malignant neoplasm of ovary: Secondary | ICD-10-CM

## 2020-10-30 DIAGNOSIS — Z8249 Family history of ischemic heart disease and other diseases of the circulatory system: Secondary | ICD-10-CM | POA: Insufficient documentation

## 2020-10-30 DIAGNOSIS — Z0000 Encounter for general adult medical examination without abnormal findings: Secondary | ICD-10-CM | POA: Diagnosis not present

## 2020-10-30 DIAGNOSIS — I493 Ventricular premature depolarization: Secondary | ICD-10-CM | POA: Diagnosis not present

## 2020-10-30 DIAGNOSIS — Z87898 Personal history of other specified conditions: Secondary | ICD-10-CM

## 2020-10-30 DIAGNOSIS — K219 Gastro-esophageal reflux disease without esophagitis: Secondary | ICD-10-CM | POA: Diagnosis not present

## 2020-10-30 NOTE — Progress Notes (Signed)
Subjective:   HPI  Latasha Ramirez is a 50 y.o. female who presents for Chief Complaint  Patient presents with  . Annual Exam    Physical without fasting labs     Patient Care Team: Vickee Mormino, Camelia Eng, PA-C as PCP - General (Family Medicine) Cameron Sprang, MD as Consulting Physician (Neurology) Sees dentist Sees eye doctor, [facility] Dr. Vernell Leep, cardiology Dr. Juel Burrow, GI   Concerns: Chronic headaches, lately has not had a lot of problems with headaches, doing okay  She has history of negative BRCA gene testing  Last mammogram was last summer, through [facility].  Pap smear up-to-date as well prior through [facility].  She has appointment in August of this year for [facility] gynecology locally Last mammogram summer 2022, [facility]  Had palpiation on generic bysotlic, sees cardiology   Past Medical History:  Diagnosis Date  . Anxiety   . GERD (gastroesophageal reflux disease)   . History of bleeding ulcers   . Hypotension   . Seizures ([place])    last seizure 2010.  last EEG 2015    Family History  Problem Relation Age of Onset  . Ovarian cancer Mother   . Cancer Mother 70       ovarian  . Heart disease Father 54       died with MI  . Diabetes Father   . Diabetes Maternal Grandmother   . Suicidality Maternal Grandfather   . Diabetes Paternal Grandmother   . Skin cancer Paternal Grandfather   . Cancer Paternal Aunt        breast  . Cancer Paternal Uncle        bladder  . Cancer Paternal Aunt        breast     Current Outpatient Medications:  .  BYSTOLIC 5 MG tablet, TAKE 1 TABLET (5 MG TOTAL) BY MOUTH DAILY., Disp: 90 tablet, Rfl: 3 .  LORazepam (ATIVAN) 0.5 MG tablet, Take 1 tablet (0.5 mg total) by mouth 2 (two) times daily as needed for anxiety., Disp: 30 tablet, Rfl: 1 .  omeprazole (PRILOSEC) 40 MG capsule, TAKE 1 CAPSULE (40 MG TOTAL) BY MOUTH DAILY., Disp: 90 capsule, Rfl: 1 .  phentermine (ADIPEX-P) 37.5 MG tablet, Take  1 tablet (37.5 mg total) by mouth daily before breakfast., Disp: 30 tablet, Rfl: 1 .  primidone (MYSOLINE) 50 MG tablet, TAKE 1 TABLET (50 MG TOTAL) BY MOUTH 2 (TWO) TIMES DAILY. (Patient taking differently: Take 50 mg by mouth at bedtime.), Disp: 180 tablet, Rfl: 0 .  topiramate (TOPAMAX) 50 MG tablet, TAKE 1 TABLET BY MOUTH IN THE MORNING & 2 TABS IN THE EVENING, Disp: 270 tablet, Rfl: 3  Allergies  Allergen Reactions  . Other Rash    Foam tape cause blisters / all adhesives cause contact rash   . Adhesive [Tape]   . Flexeril [Cyclobenzaprine]     Palpitations on Flexeril and Skelaxin   . Levaquin [Levofloxacin]     Diarrhea, GI upset  . Zoloft [Sertraline Hcl]     Heart palpitations, PVCs.  Including adverse reaction to zoloft, paxil, lexapro  . Sulfa Antibiotics Rash   Past Surgical History:  Procedure Laterality Date  . CESAREAN SECTION    . CHOLECYSTECTOMY    . COLONOSCOPY  10/21/2019   Dr. Juel Burrow, normal colon  . ESOPHAGOGASTRODUODENOSCOPY    . GASTRIC BYPASS      Reviewed their medical, surgical, family, social, medication, and allergy history and updated chart as  appropriate.   Review of Systems Constitutional: -fever, -chills, -sweats, -unexpected weight change, -decreased appetite, -fatigue Allergy: -sneezing, -itching, -congestion Dermatology: -changing moles, --rash, -lumps ENT: -runny nose, -ear pain, -sore throat, -hoarseness, -sinus pain, -teeth pain, - ringing in ears, -hearing loss, -nosebleeds Cardiology: -chest pain, -palpitations, -swelling, -difficulty breathing when lying flat, -waking up short of breath Respiratory: -cough, -shortness of breath, -difficulty breathing with exercise or exertion, -wheezing, -coughing up blood Gastroenterology: -abdominal pain, -nausea, -vomiting, -diarrhea, -constipation, -blood in stool, -changes in bowel movement, -difficulty swallowing or eating Hematology: -bleeding, -bruising  Musculoskeletal: -joint aches,  -muscle aches, -joint swelling, -back pain, -neck pain, -cramping, -changes in gait Ophthalmology: denies vision changes, eye redness, itching, discharge Urology: -burning with urination, -difficulty urinating, -blood in urine, -urinary frequency, -urgency, -incontinence Neurology: +headache, -weakness, -tingling, -numbness, -memory loss, -falls, -dizziness Psychology: -depressed mood, -agitation, -sleep problems Breast/gyn: -breast tendnerss, -discharge, -lumps, -vaginal discharge,- irregular periods, -heavy periods     Objective:  BP 90/62   Pulse 85   Ht 5\' 5"  (1.651 m)   Wt 171 lb 12.8 oz (77.9 kg)   SpO2 97%   BMI 28.59 kg/m   General appearance: alert, no distress, WD/WN, Caucasian female Skin: No worrisome lesions, scattered macules HEENT: normocephalic, conjunctiva/corneas normal, sclerae anicteric, PERRLA, EOMi, nares patent, no discharge or erythema, pharynx normal Oral cavity: MMM, tongue normal, teeth normal Neck: supple, no lymphadenopathy, no thyromegaly, no masses, normal ROM, no bruits Chest: non tender, normal shape and expansion Heart: RRR, normal S1, S2, no murmurs Lungs: CTA bilaterally, no wheezes, rhonchi, or rales Abdomen: +bs, faint surgical port scars, soft, non tender, non distended, no masses, no hepatomegaly, no splenomegaly, no bruits Back: non tender, normal ROM, no scoliosis Musculoskeletal: upper extremities non tender, no obvious deformity, normal ROM throughout, lower extremities non tender, no obvious deformity, normal ROM throughout Extremities: no edema, no cyanosis, no clubbing Pulses: 2+ symmetric, upper and lower extremities, normal cap refill Neurological: alert, oriented x 3, CN2-12 intact, strength normal upper extremities and lower extremities, sensation normal throughout, DTRs 2+ throughout, no cerebellar signs, gait normal Psychiatric: normal affect, behavior normal, pleasant  Breast/gyn/rectal - deferred to gynecology     Assessment  and Plan :   Encounter Diagnoses  Name Primary?  . Encounter for health maintenance examination in adult Yes  . Family history of premature CAD   . Family history of ovarian cancer   . History of gastric bypass   . Vaccine counseling   . PVC (premature ventricular contraction)   . Gastroesophageal reflux disease, unspecified whether esophagitis present   . Essential tremor   . Seizure disorder (HCC)   . Family history of breast cancer   . History of seizure   . B12 deficiency   . Hot flashes     Today you had a preventative care visit or wellness visit.    Topics today may have included healthy lifestyle, diet, exercise, preventative care, vaccinations, sick and well care, proper use of emergency dept and after hours care, as well as other concerns.     Recommendations: Continue to return yearly for your annual wellness and preventative care visits.  This gives a chance to discuss healthy lifestyle, exercise, vaccinations, review your chart record, and perform screenings where appropriate.  I recommend you see your eye doctor yearly for routine vision care.  I recommend you see your dentist yearly for routine dental care including hygiene visits twice yearly.  See your gynecologist yearly for routine gynecological care.  Vaccination recommendations were reviewed Up-to-date on COVID tetanus and flu vaccines    Screening for cancer: Breast cancer screening: You should perform a self breast exam monthly.   We reviewed recommendations for regular mammograms and breast cancer screening.  Colon cancer screening:  I reviewed your colonoscopy on file that is up to date from 2021  Cervical cancer screening: We reviewed recommendations for pap smear screening.  Skin cancer screening: Check your skin regularly for new changes, growing lesions, or other lesions of concern Come in for evaluation if you have skin lesions of concern.  Lung cancer screening: If you have a  greater than 30 pack year history of tobacco use, then you qualify for lung cancer screening with a chest CT scan  We currently don't have screenings for other cancers besides breast, cervical, colon, and lung cancers.  If you have a strong family history of cancer or have other cancer screening concerns, please let me know.    Bone health: Get at least 150 minutes of aerobic exercise weekly Get weight bearing exercise at least once weekly   Heart health: Get at least 150 minutes of aerobic exercise weekly Limit alcohol It is important to maintain a healthy blood pressure and healthy cholesterol numbers    Separate significant issues discussed: Seizure disorder- managed by neurology, compliant with medication  Labs today given menopausal symptoms, hot flashes  History of B12 deficiency-labs today  Essential tremor- on medicine per neurology  PVCs, sees cardiology no recent complaint   Ashima was seen today for annual exam.  Diagnoses and all orders for this visit:  Encounter for health maintenance examination in adult -     Hepatic function panel -     Lipid panel -     FSH/LH -     Estrogens, Total -     Iron -     CBC with Differential/Platelet -     Vitamin B12 -     Folate  Family history of premature CAD  Family history of ovarian cancer  History of gastric bypass -     Iron -     Vitamin B12  Vaccine counseling  PVC (premature ventricular contraction)  Gastroesophageal reflux disease, unspecified whether esophagitis present  Essential tremor  Seizure disorder ([place]) -     Hepatic function panel  Family history of breast cancer  History of seizure  B12 deficiency -     Iron -     Vitamin B12 -     Folate  Hot flashes -     FSH/LH -     Estrogens, Total   Follow-up pending labs, yearly for physical

## 2020-10-31 ENCOUNTER — Other Ambulatory Visit (HOSPITAL_COMMUNITY): Payer: Self-pay

## 2020-10-31 MED FILL — Topiramate Tab 50 MG: ORAL | 90 days supply | Qty: 270 | Fill #0 | Status: CN

## 2020-11-01 LAB — CBC WITH DIFFERENTIAL/PLATELET
Basophils Absolute: 0 10*3/uL (ref 0.0–0.2)
Basos: 1 %
EOS (ABSOLUTE): 0.2 10*3/uL (ref 0.0–0.4)
Eos: 3 %
Hematocrit: 38.5 % (ref 34.0–46.6)
Hemoglobin: 12.9 g/dL (ref 11.1–15.9)
Immature Grans (Abs): 0 10*3/uL (ref 0.0–0.1)
Immature Granulocytes: 0 %
Lymphocytes Absolute: 2.2 10*3/uL (ref 0.7–3.1)
Lymphs: 39 %
MCH: 31.2 pg (ref 26.6–33.0)
MCHC: 33.5 g/dL (ref 31.5–35.7)
MCV: 93 fL (ref 79–97)
Monocytes Absolute: 0.4 10*3/uL (ref 0.1–0.9)
Monocytes: 7 %
Neutrophils Absolute: 2.7 10*3/uL (ref 1.4–7.0)
Neutrophils: 50 %
Platelets: 198 10*3/uL (ref 150–450)
RBC: 4.13 x10E6/uL (ref 3.77–5.28)
RDW: 12.2 % (ref 11.7–15.4)
WBC: 5.5 10*3/uL (ref 3.4–10.8)

## 2020-11-01 LAB — FSH/LH
FSH: 10.6 m[IU]/mL
LH: 19.3 m[IU]/mL

## 2020-11-01 LAB — HEPATIC FUNCTION PANEL
ALT: 15 IU/L (ref 0–32)
AST: 18 IU/L (ref 0–40)
Albumin: 4.1 g/dL (ref 3.8–4.8)
Alkaline Phosphatase: 62 IU/L (ref 44–121)
Bilirubin Total: <0.2 mg/dL (ref 0.0–1.2)
Bilirubin, Direct: <0.1 mg/dL (ref 0.00–0.40)
Total Protein: 6.2 g/dL (ref 6.0–8.5)

## 2020-11-01 LAB — LIPID PANEL
Chol/HDL Ratio: 2.3 ratio (ref 0.0–4.4)
Cholesterol, Total: 135 mg/dL (ref 100–199)
HDL: 59 mg/dL (ref >39)
LDL Chol Calc (NIH): 62 mg/dL (ref 0–99)
Triglycerides: 68 mg/dL (ref 0–149)
VLDL Cholesterol Cal: 14 mg/dL (ref 5–40)

## 2020-11-01 LAB — VITAMIN B12: Vitamin B-12: 1048 pg/mL (ref 232–1245)

## 2020-11-01 LAB — IRON: Iron: 57 ug/dL (ref 27–159)

## 2020-11-01 LAB — FOLATE: Folate: 16.3 ng/mL (ref >3.0)

## 2020-11-01 LAB — ESTROGENS, TOTAL: Estrogen: 207 pg/mL

## 2020-11-02 ENCOUNTER — Other Ambulatory Visit (HOSPITAL_COMMUNITY): Payer: Self-pay

## 2020-11-04 ENCOUNTER — Other Ambulatory Visit (HOSPITAL_COMMUNITY): Payer: Self-pay

## 2020-11-04 MED FILL — Topiramate Tab 50 MG: ORAL | 90 days supply | Qty: 270 | Fill #0 | Status: AC

## 2020-11-18 ENCOUNTER — Encounter: Payer: Self-pay | Admitting: Medical

## 2020-11-28 ENCOUNTER — Other Ambulatory Visit: Payer: Self-pay | Admitting: Medical

## 2020-11-28 MED FILL — Primidone Tab 50 MG: ORAL | 90 days supply | Qty: 180 | Fill #0 | Status: AC

## 2020-11-30 ENCOUNTER — Other Ambulatory Visit (HOSPITAL_COMMUNITY): Payer: Self-pay

## 2020-12-01 ENCOUNTER — Other Ambulatory Visit (HOSPITAL_COMMUNITY): Payer: Self-pay

## 2020-12-01 ENCOUNTER — Other Ambulatory Visit: Payer: Self-pay | Admitting: Medical

## 2020-12-01 NOTE — Telephone Encounter (Signed)
Latasha Ramirez out pt is requesting to fill pt phentermine. Please advise [facility]

## 2020-12-02 ENCOUNTER — Telehealth: Payer: Self-pay | Admitting: Medical

## 2020-12-02 MED ORDER — PHENTERMINE HCL 37.5 MG PO TABS
37.5000 mg | ORAL_TABLET | Freq: Every day | ORAL | 0 refills | Status: DC
  Filled 2020-12-02: qty 30, 30d supply, fill #0

## 2020-12-02 NOTE — Telephone Encounter (Signed)
I refilled qsymia today.  Will need medcheck on weight loss/medication before next refill

## 2020-12-03 ENCOUNTER — Other Ambulatory Visit (HOSPITAL_COMMUNITY): Payer: Self-pay

## 2020-12-03 NOTE — Telephone Encounter (Signed)
Called pt. LM to call back and schedule med check.

## 2020-12-21 MED FILL — Nebivolol HCl Tab 5 MG (Base Equivalent): ORAL | 90 days supply | Qty: 90 | Fill #1 | Status: AC

## 2020-12-21 MED FILL — Omeprazole Cap Delayed Release 40 MG: ORAL | 90 days supply | Qty: 90 | Fill #1 | Status: AC

## 2020-12-22 ENCOUNTER — Other Ambulatory Visit (HOSPITAL_COMMUNITY): Payer: Self-pay

## 2020-12-23 ENCOUNTER — Other Ambulatory Visit (HOSPITAL_COMMUNITY): Payer: Self-pay

## 2021-01-04 ENCOUNTER — Other Ambulatory Visit (HOSPITAL_COMMUNITY): Payer: Self-pay

## 2021-01-04 ENCOUNTER — Ambulatory Visit: Payer: 59 | Admitting: Medical

## 2021-01-04 ENCOUNTER — Encounter: Payer: Self-pay | Admitting: Medical

## 2021-01-04 ENCOUNTER — Other Ambulatory Visit: Payer: Self-pay

## 2021-01-04 VITALS — BP 102/64 | HR 64 | Ht 65.0 in | Wt 161.8 lb

## 2021-01-04 DIAGNOSIS — G40909 Epilepsy, unspecified, not intractable, without status epilepticus: Secondary | ICD-10-CM | POA: Diagnosis not present

## 2021-01-04 DIAGNOSIS — R0683 Snoring: Secondary | ICD-10-CM | POA: Diagnosis not present

## 2021-01-04 DIAGNOSIS — E663 Overweight: Secondary | ICD-10-CM | POA: Diagnosis not present

## 2021-01-04 MED ORDER — PHENTERMINE HCL 37.5 MG PO TABS
37.5000 mg | ORAL_TABLET | Freq: Every day | ORAL | 1 refills | Status: DC
  Filled 2021-01-04: qty 30, 30d supply, fill #0
  Filled 2021-02-03 – 2021-02-11 (×2): qty 30, 30d supply, fill #1

## 2021-01-04 NOTE — Progress Notes (Signed)
Subjective:  Latasha Ramirez is a 50 y.o. female who presents for Chief Complaint  Patient presents with   follow-up on weight loss    F/u on weight loss medication. Doing well on medication.    Patient Care Team: Deliyah Muckle, Cleda Mccreedy as PCP - General (Family Medicine) Van Clines, MD as Consulting Physician (Neurology) Sees dentist Sees eye doctor, [facility] Dr. Truett Mainland, cardiology Dr. Genia Harold, GI    Here for f/u on weight loss.  Just got back from a week at the beach.  Exercise - last week lots of walking on the beach.    In general staying active.   Eating healthy.     Had covid in May 2022.  During that time had craving for watermelon.   Has been eating more watermelon since covid compared to the last 30 years.    Goal weight is 135-145.  Does well on phentermine, but taking 1/2 tablet daily.  No side effects reported  On topamax for seizure disorder, seems neurology  No other aggravating or relieving factors.    No other c/o.  Past Medical History:  Diagnosis Date   Anxiety    GERD (gastroesophageal reflux disease)    History of bleeding ulcers    Hypotension    Seizures (HCC)    last seizure 2010.  last EEG 2015    The following portions of the patient's history were reviewed and updated as appropriate: allergies, current medications, past family history, past medical history, past social history, past surgical history and problem list.  ROS Otherwise as in subjective above    Objective: BP 102/64   Pulse 64   Ht 5\' 5"  (1.651 m)   Wt 161 lb 12.8 oz (73.4 kg)   SpO2 98%   BMI 26.92 kg/m   Wt Readings from Last 3 Encounters:  01/04/21 161 lb 12.8 oz (73.4 kg)  10/30/20 171 lb 12.8 oz (77.9 kg)  09/28/20 175 lb 6.4 oz (79.6 kg)   General appearance: alert, no distress, well developed, well nourished Neck: supple, no lymphadenopathy, no thyromegaly, no masses Heart: RRR, normal S1, S2, no murmurs Pulses: 2+ radial pulses, 2+ pedal  pulses, normal cap refill Ext: no edema     Assessment: Encounter Diagnoses  Name Primary?   Overweight Yes   Seizure disorder (HCC)    Snoring      Plan: Discussed medications, weight loss progress. Congratulated her on her efforts. Counseled on exercise, eating habits.  Plan to use phentermine 1/2 tablet either daily or every other day.  Plan to use this less and less over the next 2 months as she continues towards goal of about 145lb  Discussed safety and risks of medication.     Shaquanna was seen today for follow-up on weight loss.  Diagnoses and all orders for this visit:  Overweight  Seizure disorder (HCC)  Snoring  Other orders -     phentermine (ADIPEX-P) 37.5 MG tablet; Take 1 tablet (37.5 mg total) by mouth daily before breakfast.   Follow up: 1-2 mo

## 2021-01-08 ENCOUNTER — Other Ambulatory Visit (HOSPITAL_COMMUNITY): Payer: Self-pay

## 2021-01-22 ENCOUNTER — Other Ambulatory Visit: Payer: Self-pay

## 2021-01-22 ENCOUNTER — Other Ambulatory Visit (HOSPITAL_COMMUNITY): Payer: Self-pay

## 2021-01-22 ENCOUNTER — Ambulatory Visit: Payer: 59 | Admitting: Neurology

## 2021-01-22 ENCOUNTER — Encounter: Payer: Self-pay | Admitting: Neurology

## 2021-01-22 VITALS — BP 98/68 | HR 66 | Ht 65.0 in | Wt 161.0 lb

## 2021-01-22 DIAGNOSIS — Z87898 Personal history of other specified conditions: Secondary | ICD-10-CM

## 2021-01-22 DIAGNOSIS — G250 Essential tremor: Secondary | ICD-10-CM | POA: Diagnosis not present

## 2021-01-22 DIAGNOSIS — G4486 Cervicogenic headache: Secondary | ICD-10-CM | POA: Diagnosis not present

## 2021-01-22 MED ORDER — PRIMIDONE 50 MG PO TABS
50.0000 mg | ORAL_TABLET | Freq: Every day | ORAL | 3 refills | Status: DC
  Filled 2021-01-22 – 2021-03-24 (×2): qty 90, 90d supply, fill #0

## 2021-01-22 MED ORDER — TOPIRAMATE ER 100 MG PO CAP24
100.0000 mg | ORAL_CAPSULE | Freq: Every day | ORAL | 6 refills | Status: DC
  Filled 2021-01-22: qty 30, 30d supply, fill #0
  Filled 2021-02-15 – 2021-02-26 (×2): qty 30, 30d supply, fill #1
  Filled 2021-03-24: qty 30, 30d supply, fill #2
  Filled 2021-04-25: qty 30, 30d supply, fill #3
  Filled 2021-07-05: qty 30, 30d supply, fill #4

## 2021-01-22 MED ORDER — TOPIRAMATE ER 50 MG PO CAP24
ORAL_CAPSULE | ORAL | 6 refills | Status: DC
  Filled 2021-01-22: qty 30, 30d supply, fill #0
  Filled 2021-02-15 – 2021-02-26 (×2): qty 30, 30d supply, fill #1
  Filled 2021-03-24: qty 30, 30d supply, fill #2
  Filled 2021-04-25: qty 30, 30d supply, fill #3

## 2021-01-22 NOTE — Progress Notes (Signed)
NEUROLOGY FOLLOW UP OFFICE NOTE  Latasha Ramirez [id number] 27-Dec-1970  HISTORY OF PRESENT ILLNESS: I had the pleasure of seeing Latasha Ramirez in follow-up in the neurology clinic on 01/22/2021.  The patient was last seen 4 months ago for seizures, essential tremor, and cervicogenic headaches. She is alone in the office today. On her last visit, she reported decreased libido from Primidone. Dose was reduced to 50mg  qhs and she notes libido is much better, however the tremors have worsened, especially when she missed several doses. She was also reporting cognitive changes, likely due to Topiramate. She is taking 50mg  in AM, 100mg  in PM. Headaches are good, she has them a few times a month with good response to Ibuprofen. She has not had any convulsions since 2011 but had episodes of loss of consciousness possibly vasovagal, none since 2020. She denies any seizures or seizure-like symptoms, no staring/unresponsive episodes. She continues to report feeling foggy, unable to concentrate and focus. She is easily distracted with difficulty multitasking. She tripped going up stairs 3 weeks ago, no injuries.    History on Initial Assessment 09/23/2020: This is a 50 year old right-handed woman with a history of hypotension, gastric bypass, essential tremor, and seizures, presenting to establish care.  1. Seizures. The first seizure occurred at age 50, it was unwitnessed with no prior warning symptoms, she woke up on the bathroom floor with drool on her arm. She denies any tongue bite or incontinence (prior neurology notes indicate there was tongue bite and incontinence with her first GTC in July 2011). She was started on Levetiracetam then switched to Topiramate in 2018 due to side effects of feeling "zombiefied." Prior to the GTC, she was having recurrent syncopal episodes where she would lose consciousness for 15 seconds, attributed to hypotension. They would usually occur when taking a shower or getting up in the  morning. She has had 4 episodes, last occurred 1.5 years ago while taking a hot shower, her hearing went and she had tunnel vision, she called for her husband and he lowered her to the floor. As soon as she went out, she woke up, no post-event confusion. She denies any staring/unresponsive episodes, olfactory/gustatory hallucinations, myoclonic jerks. She woke up last Saturday with numbness in her left 2 fingers which took several hours to feel normal. A maternal aunt and paternal cousin have seizures. Otherwise she had a normal birth and early development, no history of febrile convulsions, CNS infections, neurosurgical procedures, significant head injuries.   2. She has benign essential tremor, with family history of tremors in at least 2 maternal cousins. She has had tremors her whole life, more noticeable on the right hand. She notices it when carrying something, it occasionally affects handwriting. It does not affect eating/drinking or affect work. She was started on Primidone in 2016, taking 100mg  daily with good response. She feels it has affected her libido for the past 3-4 years.  3. Headaches. She has had headaches for several years with pain in the back of her head. No associated nausea/vomiting, photo/phonophobia. Headaches occur at least once a week, she takes Ibuprofen or Tylenol. She notices more pain when she wakes up in the morning, back of head "is just awful," with 5-6 over 10 pain, going down to a 1-2 with increase in Topiramate to 50mg  in AM, 100mg  in PM. She was having neck pain, she saw a chiropractor which has helped some with headaches, neck pain somewhat better. She has stretching exercises.   4. Memory loss.  She is concerned about dementia, she was a Engineer, maintenance (IT) major in college but now would play dominoes and have a hard time adding up scores. She has word-finding difficulties. She denies getting lost driving or missing medications. Bills are on autopay. There is a history of  dementia on her father's side.    Prior AEDs: Levetiracetam Laboratory Data:  EEGs: Done at [facility] in [place] - normal wake and sleep EEG in 2019, 2017. Per notes, "a previous EEG (not available for review) was abnormal"  MRI: MRI without contrast done 2011 in [place], [place] reported as normal.  MRI brain 06/2020: No acute changes. I personally reviewed images, there is minimal scattered T2/FLAIR hyperintensities within the frontal predominant white matter. Hippocampi are symmetric and within normal limits.  PAST MEDICAL HISTORY: Past Medical History:  Diagnosis Date   Anxiety    GERD (gastroesophageal reflux disease)    History of bleeding ulcers    Hypotension    Seizures (HCC)    last seizure 2010.  last EEG 2015    MEDICATIONS: Current Outpatient Medications on File Prior to Visit  Medication Sig Dispense Refill   BYSTOLIC 5 MG tablet TAKE 1 TABLET (5 MG TOTAL) BY MOUTH DAILY. 90 tablet 3   omeprazole (PRILOSEC) 40 MG capsule TAKE 1 CAPSULE (40 MG TOTAL) BY MOUTH DAILY. 90 capsule 1   phentermine (ADIPEX-P) 37.5 MG tablet Take 1 tablet (37.5 mg total) by mouth daily before breakfast. 30 tablet 1   primidone (MYSOLINE) 50 MG tablet TAKE 1 TABLET (50 MG TOTAL) BY MOUTH 2 (TWO) TIMES DAILY. (Patient taking differently: Take 50 mg by mouth at bedtime.) 180 tablet 0   topiramate (TOPAMAX) 50 MG tablet TAKE 1 TABLET BY MOUTH IN THE MORNING & 2 TABS IN THE EVENING 270 tablet 3   No current facility-administered medications on file prior to visit.    ALLERGIES: Allergies  Allergen Reactions   Other Rash    Foam tape cause blisters / all adhesives cause contact rash    Adhesive [Tape]    Flexeril [Cyclobenzaprine]     Palpitations on Flexeril and Skelaxin    Levaquin [Levofloxacin]     Diarrhea, GI upset   Zoloft [Sertraline Hcl]     Heart palpitations, PVCs.  Including adverse reaction to zoloft, paxil, lexapro   Sulfa Antibiotics Rash    FAMILY HISTORY: Family  History  Problem Relation Age of Onset   Ovarian cancer Mother    Cancer Mother 18       ovarian   Heart disease Father 20       died with MI   Diabetes Father    Diabetes Maternal Grandmother    Suicidality Maternal Grandfather    Diabetes Paternal Grandmother    Skin cancer Paternal Grandfather    Cancer Paternal Aunt        breast   Cancer Paternal Uncle        bladder   Cancer Paternal Aunt        breast    SOCIAL HISTORY: Social History   Socioeconomic History   Marital status: Divorced    Spouse name: Not on file   Number of children: 1   Years of education: Not on file   Highest education level: Not on file  Occupational History   Not on file  Tobacco Use   Smoking status: Never   Smokeless tobacco: Never  Vaping Use   Vaping Use: Never used  Substance and Sexual Activity   Alcohol  use: Yes    Comment: socially, rarely   Drug use: Never   Sexual activity: Not on file  Other Topics Concern   Not on file  Social History Narrative   Lives with significant other and has son in college   Exercise - walking, yoga, piliates, some weights   Right handed    Works as work site Materials engineer   10/30/20   Social Determinants of Corporate investment banker Strain: Not on file  Food Insecurity: Not on file  Transportation Needs: Not on file  Physical Activity: Not on file  Stress: Not on file  Social Connections: Not on file  Intimate Partner Violence: Not on file     PHYSICAL EXAM: Vitals:   01/22/21 1351  BP: 98/68  Pulse: 66  SpO2: 96%   General: No acute distress Head:  Normocephalic/atraumatic Skin/Extremities: No rash, no edema Neurological Exam: alert and awake. No aphasia or dysarthria. Fund of knowledge is appropriate.  Attention and concentration are normal.   Cranial nerves: Pupils equal, round. Extraocular movements intact with no nystagmus. Visual fields full.  No facial asymmetry.  Motor: Bulk and tone normal, muscle strength 5/5  throughout with no pronator drift.   Finger to nose testing intact.  Gait narrow-based and steady, no ataxia. No resting tremor, very minimal postural tremor today. No action tremor.   IMPRESSION: This is a pleasant 50 yo RH hypotension, gastric bypass, essential tremor, seizures, and headaches suggestive of cervicogenic headaches. No seizures since 2011, she has a history of vasovagal syncope, last episode was in 2020. She has been on Topiramate for seizure and headache prophylaxis, this can also help with tremors. She however continues to note feeling cognitive changes and will switch to Trokendi XR 150mg  qhs to hopefully help with this side effect. The reduction in tremor has helped with decreased libido but she notes more tremors. We again discussed weighing benefits versus side effects, continue on current dose for now, tremors appear controlled today on this dose. She is aware of [place] driving laws to stop driving after a seizure until 6 months seizure-free. Follow-up in 3 months, call for any changes.   Thank you for allowing me to participate in her care.  Please do not hesitate to call for any questions or concerns.    , M.D.   CC: Patrcia Dolly, PA-C

## 2021-01-22 NOTE — Patient Instructions (Signed)
Switch to extended-release Topiramate (Trokendi XR): Take 100mg  and 50mg  capsules every night (total of 150mg  every night)  2. Continue Primidone 50mg  every night  3. Follow-up in 3 months, call for any changes   Seizure Precautions: 1. If medication has been prescribed for you to prevent seizures, take it exactly as directed.  Do not stop taking the medicine without talking to your doctor first, even if you have not had a seizure in a long time.   2. Avoid activities in which a seizure would cause danger to yourself or to others.  Don't operate dangerous machinery, swim alone, or climb in high or dangerous places, such as on ladders, roofs, or girders.  Do not drive unless your doctor says you may.  3. If you have any warning that you may have a seizure, lay down in a safe place where you can't hurt yourself.    4.  No driving for 6 months from last seizure, as per [facility].   Please refer to the following link on the Epile[employer] of America's website for more information: http://www.epilepsyfoundation.org/answerplace/Social/driving/drivingu.cfm   5.  Maintain good sleep hygiene. Avoid alcohol.  6.  Contact your doctor if you have any problems that may be related to the medicine you are taking.  7.  Call 911 and bring the patient back to the ED if:        A.  The seizure lasts longer than 5 minutes.       B.  The patient doesn't awaken shortly after the seizure  C.  The patient has new problems such as difficulty seeing, speaking or moving  D.  The patient was injured during the seizure  E.  The patient has a temperature over 102 F (39C)  F.  The patient vomited and now is having trouble breathing

## 2021-02-03 ENCOUNTER — Encounter: Payer: 59 | Admitting: Obstetrics & Gynecology

## 2021-02-03 ENCOUNTER — Other Ambulatory Visit (HOSPITAL_COMMUNITY): Payer: Self-pay

## 2021-02-05 ENCOUNTER — Encounter: Payer: 59 | Admitting: Obstetrics & Gynecology

## 2021-02-11 ENCOUNTER — Other Ambulatory Visit (HOSPITAL_COMMUNITY): Payer: Self-pay

## 2021-02-11 MED ORDER — CARESTART COVID-19 HOME TEST VI KIT
PACK | 0 refills | Status: DC
  Filled 2021-02-11: qty 2, 2d supply, fill #0

## 2021-02-12 ENCOUNTER — Encounter: Payer: 59 | Admitting: Obstetrics & Gynecology

## 2021-02-15 ENCOUNTER — Other Ambulatory Visit (HOSPITAL_COMMUNITY): Payer: Self-pay

## 2021-02-23 ENCOUNTER — Other Ambulatory Visit (HOSPITAL_COMMUNITY): Payer: Self-pay

## 2021-02-23 DIAGNOSIS — Z6826 Body mass index (BMI) 26.0-26.9, adult: Secondary | ICD-10-CM | POA: Diagnosis not present

## 2021-02-23 DIAGNOSIS — Z8041 Family history of malignant neoplasm of ovary: Secondary | ICD-10-CM | POA: Diagnosis not present

## 2021-02-23 DIAGNOSIS — Z01419 Encounter for gynecological examination (general) (routine) without abnormal findings: Secondary | ICD-10-CM | POA: Diagnosis not present

## 2021-02-23 DIAGNOSIS — N762 Acute vulvitis: Secondary | ICD-10-CM | POA: Diagnosis not present

## 2021-02-23 LAB — HM PAP SMEAR: HM Pap smear: NEGATIVE

## 2021-02-24 ENCOUNTER — Other Ambulatory Visit (HOSPITAL_COMMUNITY): Payer: Self-pay

## 2021-02-24 MED ORDER — CLOTRIMAZOLE-BETAMETHASONE 1-0.05 % EX CREA
TOPICAL_CREAM | CUTANEOUS | 3 refills | Status: DC
  Filled 2021-02-24: qty 45, 20d supply, fill #0
  Filled 2021-08-18: qty 45, 20d supply, fill #1

## 2021-02-26 ENCOUNTER — Other Ambulatory Visit (HOSPITAL_COMMUNITY): Payer: Self-pay

## 2021-02-26 DIAGNOSIS — Z803 Family history of malignant neoplasm of breast: Secondary | ICD-10-CM | POA: Diagnosis not present

## 2021-02-26 DIAGNOSIS — Z8041 Family history of malignant neoplasm of ovary: Secondary | ICD-10-CM | POA: Diagnosis not present

## 2021-02-26 DIAGNOSIS — Z8051 Family history of malignant neoplasm of kidney: Secondary | ICD-10-CM | POA: Diagnosis not present

## 2021-02-26 DIAGNOSIS — Z8052 Family history of malignant neoplasm of bladder: Secondary | ICD-10-CM | POA: Diagnosis not present

## 2021-02-26 DIAGNOSIS — Z801 Family history of malignant neoplasm of trachea, bronchus and lung: Secondary | ICD-10-CM | POA: Diagnosis not present

## 2021-02-26 DIAGNOSIS — Z808 Family history of malignant neoplasm of other organs or systems: Secondary | ICD-10-CM | POA: Diagnosis not present

## 2021-02-26 DIAGNOSIS — Z800 Family history of malignant neoplasm of digestive organs: Secondary | ICD-10-CM | POA: Diagnosis not present

## 2021-03-24 MED FILL — Nebivolol HCl Tab 5 MG (Base Equivalent): ORAL | 90 days supply | Qty: 90 | Fill #2 | Status: AC

## 2021-03-25 ENCOUNTER — Other Ambulatory Visit (HOSPITAL_COMMUNITY): Payer: Self-pay

## 2021-04-12 ENCOUNTER — Other Ambulatory Visit: Payer: Self-pay | Admitting: Medical

## 2021-04-12 ENCOUNTER — Other Ambulatory Visit (HOSPITAL_COMMUNITY): Payer: Self-pay

## 2021-04-13 ENCOUNTER — Other Ambulatory Visit (HOSPITAL_COMMUNITY): Payer: Self-pay

## 2021-04-13 DIAGNOSIS — Z803 Family history of malignant neoplasm of breast: Secondary | ICD-10-CM | POA: Diagnosis not present

## 2021-04-13 MED ORDER — OMEPRAZOLE 40 MG PO CPDR
40.0000 mg | DELAYED_RELEASE_CAPSULE | Freq: Every day | ORAL | 0 refills | Status: DC
  Filled 2021-04-13: qty 90, 90d supply, fill #0

## 2021-04-16 ENCOUNTER — Other Ambulatory Visit: Payer: Self-pay | Admitting: Obstetrics and Gynecology

## 2021-04-16 ENCOUNTER — Other Ambulatory Visit (HOSPITAL_COMMUNITY): Payer: Self-pay | Admitting: Obstetrics and Gynecology

## 2021-04-16 DIAGNOSIS — Z803 Family history of malignant neoplasm of breast: Secondary | ICD-10-CM

## 2021-04-26 ENCOUNTER — Other Ambulatory Visit (HOSPITAL_COMMUNITY): Payer: Self-pay

## 2021-04-28 ENCOUNTER — Other Ambulatory Visit: Payer: Self-pay

## 2021-04-28 ENCOUNTER — Other Ambulatory Visit (HOSPITAL_COMMUNITY): Payer: Self-pay

## 2021-04-28 ENCOUNTER — Ambulatory Visit: Payer: 59 | Admitting: Neurology

## 2021-04-28 ENCOUNTER — Ambulatory Visit (HOSPITAL_COMMUNITY)
Admission: RE | Admit: 2021-04-28 | Discharge: 2021-04-28 | Disposition: A | Discharge status: Home / Self Care | Payer: 59 | Source: Ambulatory Visit | Attending: Obstetrics and Gynecology | Admitting: Obstetrics and Gynecology

## 2021-04-28 ENCOUNTER — Encounter: Payer: Self-pay | Admitting: Neurology

## 2021-04-28 VITALS — BP 110/54 | HR 66 | Ht 65.0 in | Wt 162.0 lb

## 2021-04-28 DIAGNOSIS — G4486 Cervicogenic headache: Secondary | ICD-10-CM

## 2021-04-28 DIAGNOSIS — Z853 Personal history of malignant neoplasm of breast: Secondary | ICD-10-CM | POA: Diagnosis not present

## 2021-04-28 DIAGNOSIS — G250 Essential tremor: Secondary | ICD-10-CM

## 2021-04-28 DIAGNOSIS — Z803 Family history of malignant neoplasm of breast: Secondary | ICD-10-CM

## 2021-04-28 DIAGNOSIS — N63 Unspecified lump in unspecified breast: Secondary | ICD-10-CM | POA: Diagnosis not present

## 2021-04-28 DIAGNOSIS — R4789 Other speech disturbances: Secondary | ICD-10-CM | POA: Diagnosis not present

## 2021-04-28 DIAGNOSIS — Z87898 Personal history of other specified conditions: Secondary | ICD-10-CM

## 2021-04-28 IMAGING — MR MR BREAST BILAT WO/W CM
8 of 11 series · 29 of 48 positions shown · IV contrast (7ml GADAVIST)
Comparison: [DATE]

CLINICAL DATA: Strong family history of breast cancer.

LABS:  None obtained at the time of imaging.
EXAM:
BILATERAL BREAST MRI WITH AND WITHOUT CONTRAST
TECHNIQUE: Multiplanar, multisequence MR images of both breasts were obtained
prior to and following the intravenous administration of 7 ml of
Gadavist

[Series 2: T2 · axial · 3.0mm · 0.91mm/px · 1 of 63 slices shown]
[im 1/63]
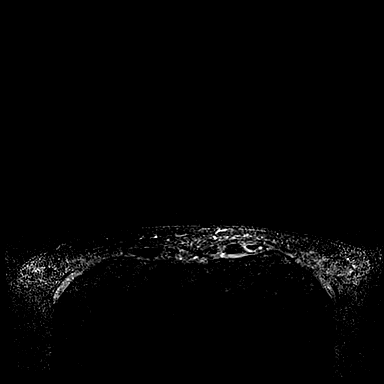

[Series 3: T1 fat-sat · axial · 1.2mm · 0.78mm/px · z∈[-132,+59]mm · 4 of 160 slices shown (1 of 4)]
[im 1/160]
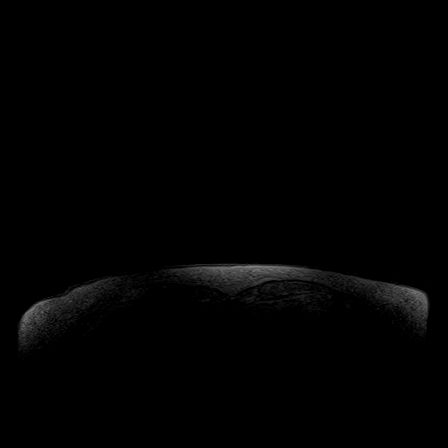
[im 54/160]
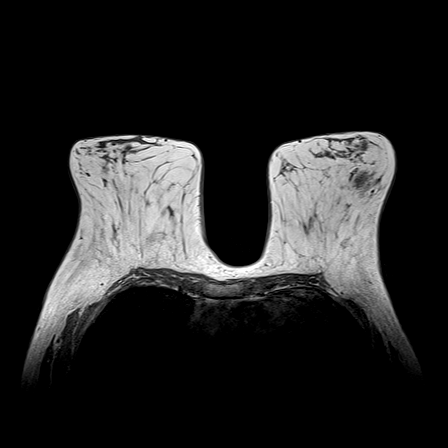
[im 107/160]
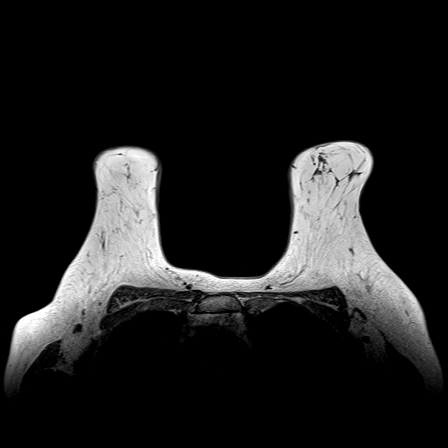
[im 160/160]
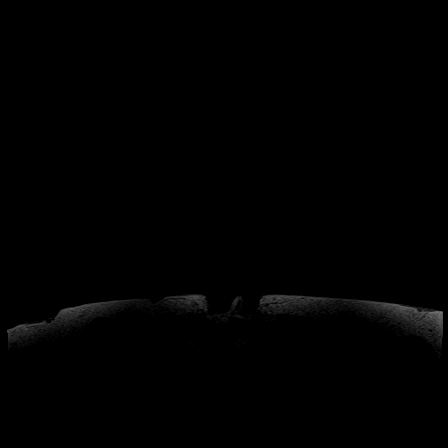

[Series 5: T1 fat-sat · axial · 1.2mm · 0.84mm/px · z∈[-141,+69]mm · 5 of 176 slices shown (2 of 4)]
[im 1/176]
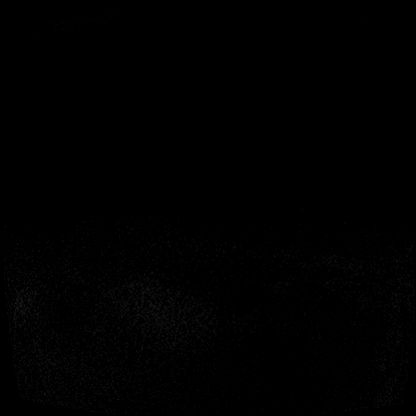
[im 44/176]
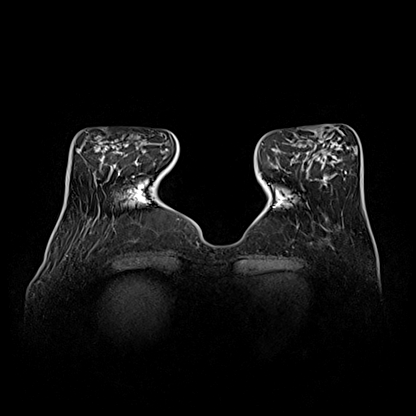
[im 88/176]
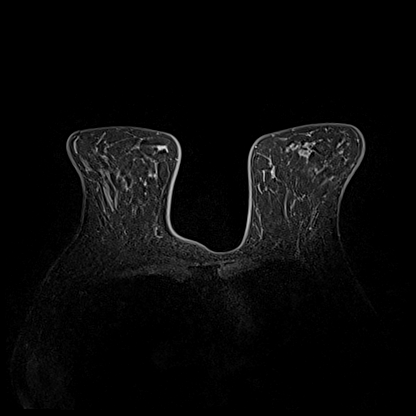
[im 132/176]
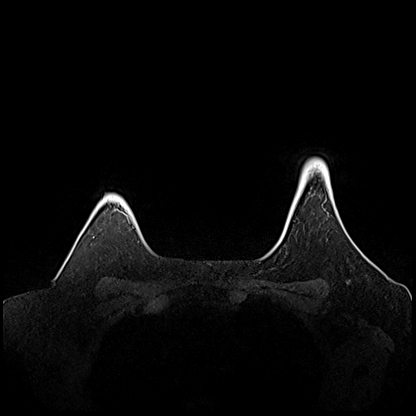
[im 176/176]
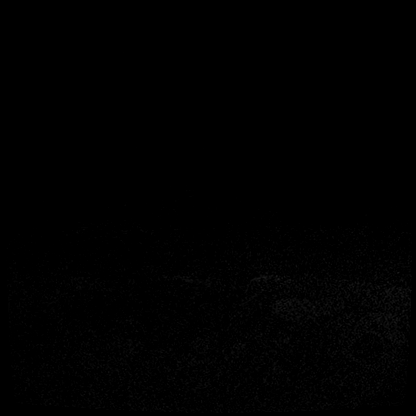

[Series 6: T1 fat-sat · axial · 1.2mm · 0.84mm/px · z∈[-141,+69]mm · 6 of 176 slices shown (3 of 4)]
[im 1/176]
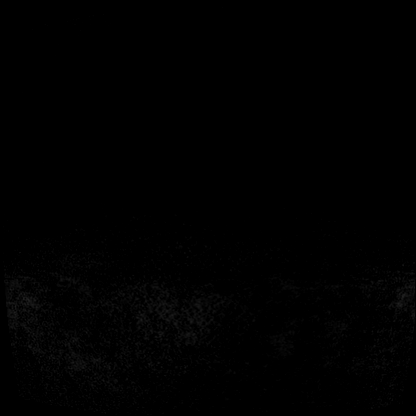
[im 36/176]
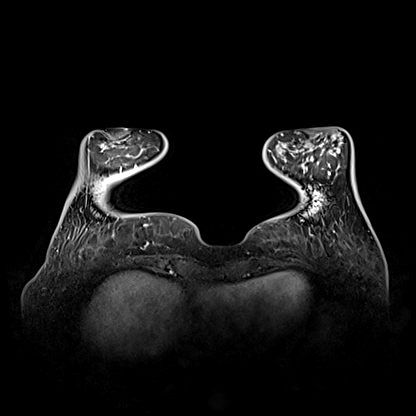
[im 71/176]
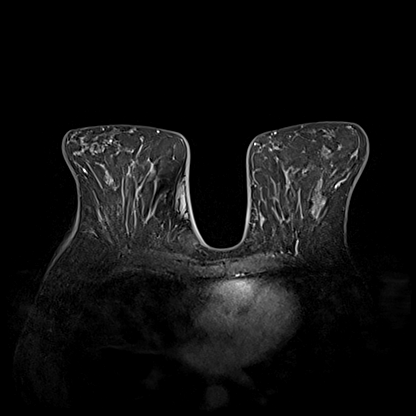
[im 106/176]
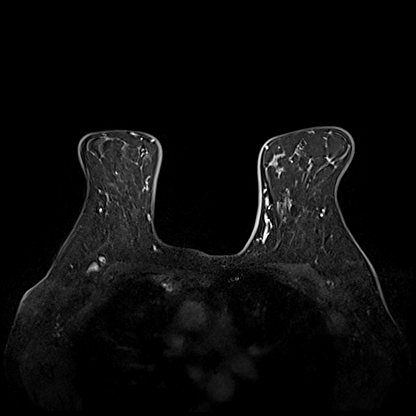
[im 141/176]
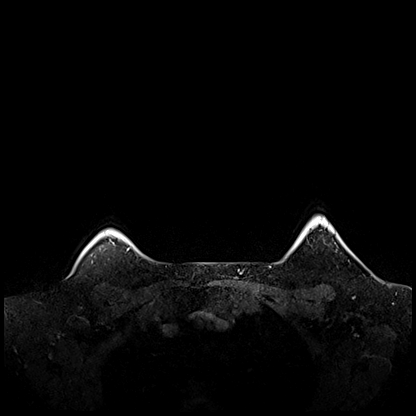
[im 176/176]
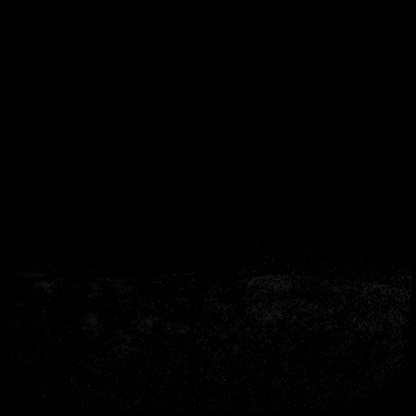

[Series 7: T1 · axial · 1.2mm · 0.84mm/px · z∈[-141,+69]mm · 6 of 176 slices shown (1 of 3)]
[im 1/176]
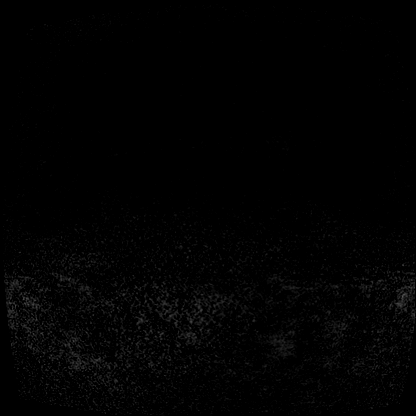
[im 36/176]
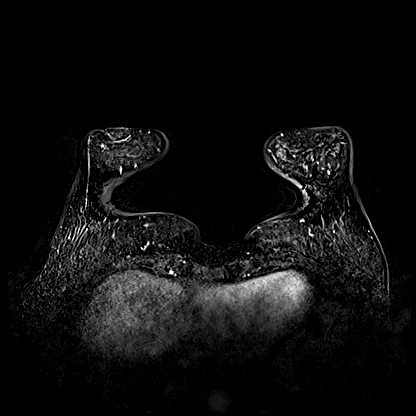
[im 71/176]
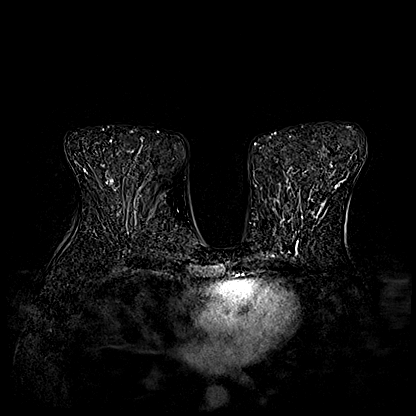
[im 106/176]
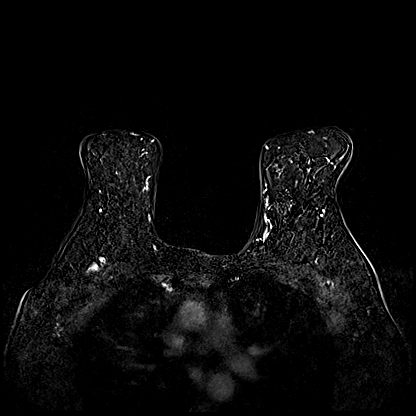
[im 141/176]
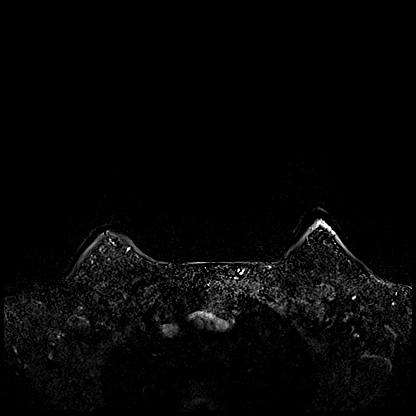
[im 176/176]
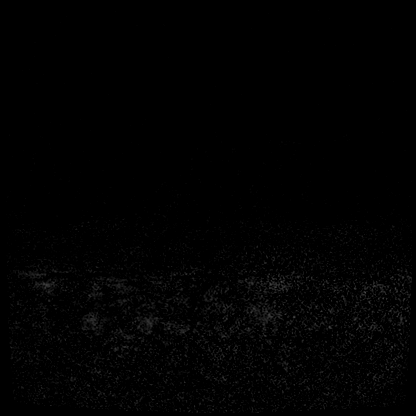

[Series 8: T1 · coronal · 350.0mm · 0.84mm/px · 1 of 3 slices shown (2 of 3)]
[im 1/3]
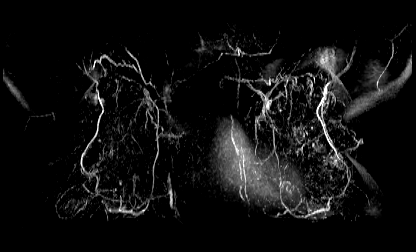

[Series 9: T1 · axial · 211.2mm · 0.84mm/px · 1 of 3 slices shown (3 of 3)]
[im 1/3]
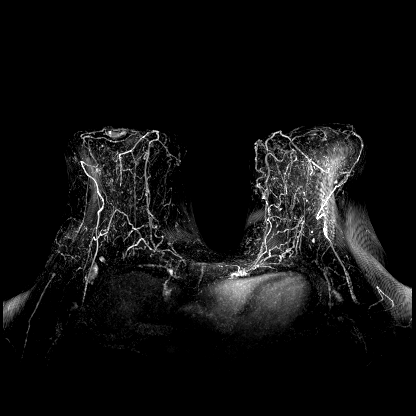

[Series 10: T1 fat-sat · axial · 1.2mm · 0.84mm/px · z∈[-141,+27]mm · 5 of 176 slices shown (4 of 4)]
[im 1/176]
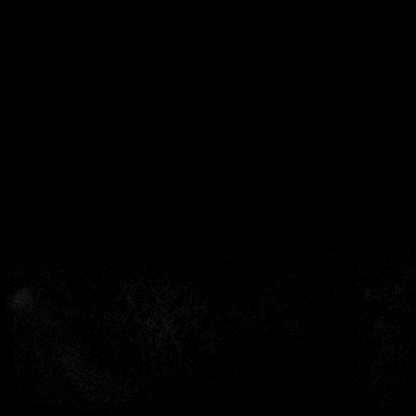
[im 36/176]
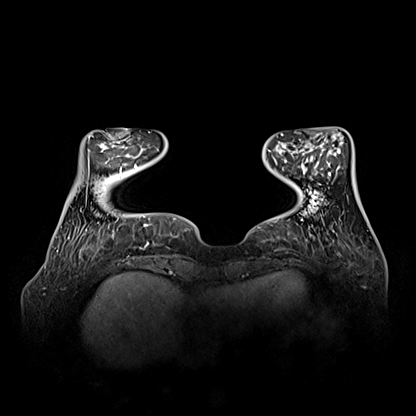
[im 71/176]
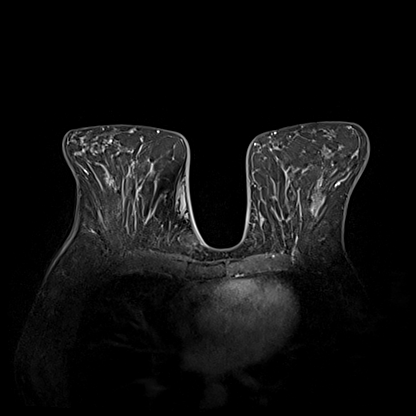
[im 106/176]
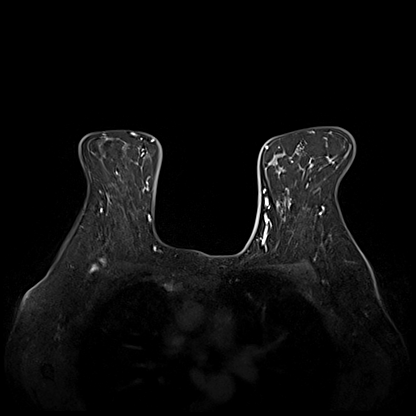
[im 141/176]
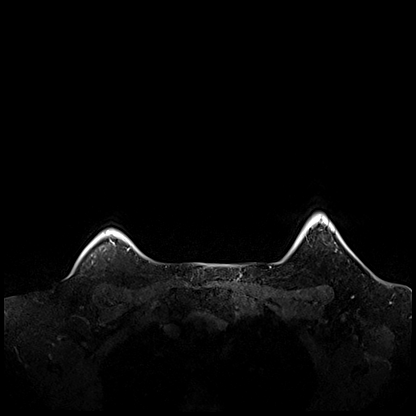

[29 of 48 positions shown; findings below may reference images not displayed]

Three-dimensional MR images were rendered by post-processing of the
original MR data on an independent workstation. The
three-dimensional MR images were interpreted, and findings are
reported in the following complete MRI report for this study. Three
dimensional images were evaluated at the independent interpreting
workstation using the DynaCAD thin client.
FINDINGS: Breast composition: b. Scattered fibroglandular tissue.

Background parenchymal enhancement: Moderate.

Right breast: Within the LATERAL midportion of the RIGHT breast
middle depth, there is an area of non mass enhancement spanning
x 1.2 x 1.7 centimeters. Enhancement shows persistent type
enhancement kinetics. (Image 106 of series 13).

Left breast: Within the LATERAL midportion of the LEFT breast
anterior to middle depth, there is an area of non mass enhancement
showing persistent type enhancement kinetics and measures 2.6 x
x 1.9 centimeters. (Image 110 of series 13).

Lymph nodes: No abnormal appearing lymph nodes.

Ancillary findings:  None.
IMPRESSION: 1. Focal non mass enhancement in the LATERAL midportion of the RIGHT
breast at middle depth, 1.7 centimeters in diameter.
2. Focal non mass enhancement in the LATERAL midportion of the LEFT
breast at middle depth, 2.6 centimeters in diameter.

RECOMMENDATION:
Recommend MR guided core biopsy of both breasts.

BI-RADS CATEGORY  4: Suspicious.

## 2021-04-28 MED ORDER — GADOBUTROL 1 MMOL/ML IV SOLN
7.0000 mL | Freq: Once | INTRAVENOUS | Status: AC | PRN
  Administered 2021-04-28: 7 mL via INTRAVENOUS

## 2021-04-28 MED ORDER — PRIMIDONE 50 MG PO TABS
50.0000 mg | ORAL_TABLET | Freq: Every day | ORAL | 3 refills | Status: DC
  Filled 2021-04-28 – 2021-07-05 (×2): qty 90, 90d supply, fill #0

## 2021-04-28 MED ORDER — ZONISAMIDE 100 MG PO CAPS
ORAL_CAPSULE | ORAL | 3 refills | Status: DC
  Filled 2021-04-28: qty 90, 44d supply, fill #0

## 2021-04-28 NOTE — Patient Instructions (Signed)
Start the Zonisamide 100mg : Take 1 capsule every night for 2 weeks, then increase to 2 capsules every night for 2 weeks, then increase to 3 capsules every night and continue  2. When you start Zonisamide, reduce Trokendi to 100mg  every night for 2 weeks, then 50mg  every night for 2 weeks, then stop Trokendi  3. Continue Primidone 50mg  every night  4. Follow-up in 3 months, call for any changes

## 2021-04-28 NOTE — Progress Notes (Signed)
NEUROLOGY FOLLOW UP OFFICE NOTE  Latasha Ramirez [id number] 10/21/1970  HISTORY OF PRESENT ILLNESS: I had the pleasure of seeing Renu Asby in follow-up in the neurology clinic on 04/28/2021.  The patient was last seen 3 months ago for seizures, essential tremor, and cervicogenic headaches. She is alone in the office today. On her last visit, she reported worsening of tremors with reduction in Primidone to $RemoveBefo'50mg'aSdsLCLuRxO$  qhs. Dose was reduced due to side effects of decreased libido, which improved on lower dose. She was reporting cognitive changes and switched from Topiramate to Trokendi XR. She was initially concerned last August that Trokendi was causing about right hip/shoulder pain and vaginitis, discussed this is of low likelihood. She has continued taking Trokendi $RemoveBeforeD'150mg'rEYtwWTMomTaMf$  daily and does feel better off Topiramate IR, but still has had hard time going for a word when she tries to say something. She gets tearful because her boss and supervisor had brought it up, pointing out there are long pauses where she does not know what she is talking about. The tremor is better today, she notes it depends on what she is doing, usually worse when she first wakes up in the morning. She is noted to be anxious and tearful, asking if autism is the cause of her symptoms and she was reassured that she does not have autism. She notes she had side effects on Sertraline and Fluoxetine in the past. She remains seizure-free since 2011, no vasovagal synceop since 2020. She recently fell over her dog in the dark, no significant injuries.    History on Initial Assessment 09/23/2020: This is a 50 year old right-handed woman with a history of hypotension, gastric bypass, essential tremor, and seizures, presenting to establish care.  1. Seizures. The first seizure occurred at age 36, it was unwitnessed with no prior warning symptoms, she woke up on the bathroom floor with drool on her arm. She denies any tongue bite or incontinence (prior  neurology notes indicate there was tongue bite and incontinence with her first GTC in July 2011). She was started on Levetiracetam then switched to Topiramate in 2018 due to side effects of feeling "zombiefied." Prior to the GTC, she was having recurrent syncopal episodes where she would lose consciousness for 15 seconds, attributed to hypotension. They would usually occur when taking a shower or getting up in the morning. She has had 4 episodes, last occurred 1.5 years ago while taking a hot shower, her hearing went and she had tunnel vision, she called for her husband and he lowered her to the floor. As soon as she went out, she woke up, no post-event confusion. She denies any staring/unresponsive episodes, olfactory/gustatory hallucinations, myoclonic jerks. She woke up last Saturday with numbness in her left 2 fingers which took several hours to feel normal. A maternal aunt and paternal cousin have seizures. Otherwise she had a normal birth and early development, no history of febrile convulsions, CNS infections, neurosurgical procedures, significant head injuries.   2. She has benign essential tremor, with family history of tremors in at least 2 maternal cousins. She has had tremors her whole life, more noticeable on the right hand. She notices it when carrying something, it occasionally affects handwriting. It does not affect eating/drinking or affect work. She was started on Primidone in 2016, taking $RemoveBefo'100mg'KoqQKTcHLLP$  daily with good response. She feels it has affected her libido for the past 3-4 years.  3. Headaches. She has had headaches for several years with pain in the back of her head.  No associated nausea/vomiting, photo/phonophobia. Headaches occur at least once a week, she takes Ibuprofen or Tylenol. She notices more pain when she wakes up in the morning, back of head "is just awful," with 5-6 over 10 pain, going down to a 1-2 with increase in Topiramate to $RemoveBefor'50mg'oyqKWrbQvwxo$  in AM, $Remo'100mg'iUQvA$  in PM. She was having neck pain,  she saw a chiropractor which has helped some with headaches, neck pain somewhat better. She has stretching exercises.   4. Memory loss. 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 She has word-finding difficulties. She denies getting lost driving or missing medications. Bills are on autopay. There is a history of dementia on her father's side.    Prior AEDs: Levetiracetam Laboratory Data:  EEGs: Done at [facility] in [place] - normal wake and sleep EEG in 2019, 2017. Per notes, "a previous EEG (not available for review) was abnormal"  MRI: MRI without contrast done 2011 in [place], [place] reported as normal.  MRI brain 06/2020: No acute changes. I personally reviewed images, there is minimal scattered T2/FLAIR hyperintensities within the frontal predominant white matter. Hippocampi are symmetric and within normal limits.   PAST MEDICAL HISTORY: Past Medical History:  Diagnosis Date   Anxiety    GERD (gastroesophageal reflux disease)    History of bleeding ulcers    Hypotension    Seizures ([place])    last seizure 2010.  last EEG 2015    MEDICATIONS: Current Outpatient Medications on File Prior to Visit  Medication Sig Dispense Refill   BYSTOLIC 5 MG tablet TAKE 1 TABLET (5 MG TOTAL) BY MOUTH DAILY. 90 tablet 3   clotrimazole-betamethasone (LOTRISONE) cream APPLY TO THE AFFECTED AND SURROUNDING AREAS OF SKIN BY TOPICAL ROUTE 2 TIMES PER DAY IN THE MORNING AND EVENING AS NEEDED 45 g 3   COVID-19 At Home Antigen Test (CARESTART COVID-19 HOME TEST) KIT USE AS DIRECTED WITHIN PACKAGE INSTRUCTIONS. 2 each 0   omeprazole (PRILOSEC) 40 MG capsule Take 1 capsule (40 mg total) by mouth daily. 90 capsule 0   phentermine (ADIPEX-P) 37.5 MG tablet Take 1 tablet (37.5 mg total) by mouth daily before breakfast. 30 tablet 1   primidone (MYSOLINE) 50 MG tablet Take 1 tablet (50 mg total) by mouth at bedtime.  90 tablet 3   Topiramate ER (TROKENDI XR) 100 MG CP24 Take 100 mg by mouth daily at night. (Take with Trokendi $RemoveBefor'50mg'GWlXTBITtTWj$  capsule for total of $Remove'150mg'GwLMLwo$  every night) 30 capsule 6   Topiramate ER (TROKENDI XR) 50 MG CP24 Take 1 capsule every night (Take with Trokendi $RemoveBefor'100mg'VtjraExoBOvR$  capsule for total of $Remove'150mg'sRZPzNz$  every night) 30 capsule 6   No current facility-administered medications on file prior to visit.    ALLERGIES: Allergies  Allergen Reactions   Other Rash    Foam tape cause blisters / all adhesives cause contact rash    Adhesive [Tape]    Flexeril [Cyclobenzaprine]     Palpitations on Flexeril and Skelaxin    Levaquin [Levofloxacin]     Diarrhea, GI upset   Zoloft [Sertraline Hcl]     Heart palpitations, PVCs.  Including adverse reaction to zoloft, paxil, lexapro   Sulfa Antibiotics Rash    FAMILY HISTORY: Family History  Problem Relation Age of Onset   Ovarian cancer Mother    Cancer Mother 27       ovarian   Heart disease Father 68  died with MI   Diabetes Father    Diabetes Maternal Grandmother    Suicidality Maternal Grandfather    Diabetes Paternal Grandmother    Skin cancer Paternal Grandfather    Cancer Paternal Aunt        breast   Cancer Paternal Uncle        bladder   Cancer Paternal Aunt        breast    SOCIAL HISTORY: Social History   Socioeconomic History   Marital status: Divorced    Spouse name: Not on file   Number of children: 1   Years of education: Not on file   Highest education level: Not on file  Occupational History   Not on file  Tobacco Use   Smoking status: Never   Smokeless tobacco: Never  Vaping Use   Vaping Use: Never used  Substance and Sexual Activity   Alcohol use: Yes    Comment: socially, rarely   Drug use: Never   Sexual activity: Not on file  Other Topics Concern   Not on file  Social History Narrative   Lives with significant other and has son in college   Exercise - walking, yoga, piliates, some weights   Right handed     Works as work site Buyer, retail   10/30/20   Social Determinants of Radio broadcast assistant Strain: Not on file  Food Insecurity: Not on file  Transportation Needs: Not on file  Physical Activity: Not on file  Stress: Not on file  Social Connections: Not on file  Intimate Partner Violence: Not on file     PHYSICAL EXAM: Vitals:   04/28/21 1541  BP: (!) 110/54  Pulse: 66  SpO2: 99%   General: No acute distress, tearful at times, anxious Head:  Normocephalic/atraumatic Skin/Extremities: No rash, no edema Neurological Exam: alert and awake. No aphasia or dysarthria. Fund of knowledge is appropriate.  Attention and concentration are normal.   Cranial nerves: Pupils equal, round. Extraocular movements intact with no nystagmus. Visual fields full.  No facial asymmetry.  Motor: Bulk and tone normal, muscle strength 5/5 throughout with no pronator drift.   Finger to nose testing intact.  Gait narrow-based and steady, able to tandem walk adequately.  Romberg negative. No significant tremor in office today.   IMPRESSION: This is a pleasant 50 yo RH hypotension, gastric bypass, essential tremor, seizures, and headaches suggestive of cervicogenic headaches. No seizures since 2011, she has a history of vasovagal syncope, last episode was in 2020. She continues to note word-finding difficulties despite switching to Trokendi, we discussed switching to Zonisamide for seizure and headache prophylaxis. Side effects discussed. Continue Primidone 50mg  qhs for essential tremor. Discuss anxiety with PCP, she may benefit from seeing Behavioral health. She is aware of [place] driving laws to stop driving after a seizure until 6 months seizure-free. Follow-up in 3 months, call for any changes.    Thank you for allowing me to participate in her care.  Please do not hesitate to call for any questions or concerns.    Ellouise Newer, M.D.   CC: Chana Bode, PA-C

## 2021-04-29 ENCOUNTER — Other Ambulatory Visit: Payer: Self-pay | Admitting: Obstetrics and Gynecology

## 2021-04-29 DIAGNOSIS — R9389 Abnormal findings on diagnostic imaging of other specified body structures: Secondary | ICD-10-CM

## 2021-05-05 ENCOUNTER — Other Ambulatory Visit (HOSPITAL_COMMUNITY): Payer: Self-pay

## 2021-05-05 MED ORDER — ALPRAZOLAM 0.25 MG PO TABS
0.2500 mg | ORAL_TABLET | Freq: Three times a day (TID) | ORAL | 0 refills | Status: DC | PRN
  Filled 2021-05-05: qty 10, 4d supply, fill #0

## 2021-05-06 ENCOUNTER — Other Ambulatory Visit (HOSPITAL_COMMUNITY): Payer: Self-pay | Admitting: Diagnostic Radiology

## 2021-05-06 ENCOUNTER — Ambulatory Visit
Admission: RE | Admit: 2021-05-06 | Discharge: 2021-05-06 | Disposition: A | Discharge status: Home / Self Care | Payer: 59 | Source: Ambulatory Visit | Attending: Obstetrics and Gynecology | Admitting: Obstetrics and Gynecology

## 2021-05-06 ENCOUNTER — Other Ambulatory Visit: Payer: Self-pay

## 2021-05-06 DIAGNOSIS — R9389 Abnormal findings on diagnostic imaging of other specified body structures: Secondary | ICD-10-CM

## 2021-05-06 DIAGNOSIS — R928 Other abnormal and inconclusive findings on diagnostic imaging of breast: Secondary | ICD-10-CM | POA: Diagnosis not present

## 2021-05-06 DIAGNOSIS — N6011 Diffuse cystic mastopathy of right breast: Secondary | ICD-10-CM | POA: Diagnosis not present

## 2021-05-06 DIAGNOSIS — N62 Hypertrophy of breast: Secondary | ICD-10-CM | POA: Diagnosis not present

## 2021-05-06 LAB — HM MAMMOGRAPHY

## 2021-05-06 IMAGING — MR MR BREAST BX W LOC DEV 1ST LESION IMAGE BX SPEC MR GUIDE*L*
7 of 10 series · 30 of 48 positions shown · IV contrast (8 ml GADAVIST)
Comparison: Previous exams.
COMPARISON: Previous exams.

Addendum:
CLINICAL DATA: The patient has a strong family history of breast
cancer and was recently genetically tested and has a calculated
lifetime risk of developing breast cancer of 26%. A 1.7 cm area of
non mass enhancement was demonstrated in the lateral mid right
breast on a recent screening MRI and a 2.6 cm similar area was
demonstrated in the outer mid left breast. Both were recommended for
MR guided core needle biopsy.

EXAM:
MRI GUIDED CORE NEEDLE BIOPSY OF THE BILATERAL BREASTS
TECHNIQUE: Multiplanar, multisequence MR imaging of the both breasts was
performed both before and after administration of intravenous
contrast.
CONTRAST:  9mL GADAVIST GADOBUTROL 1 MMOL/ML IV SOLN

[Series 15: fiducial bilateral · sagittal · 2.0mm · 1.33mm/px · 3 of 144 slices shown]
[im 1/144]
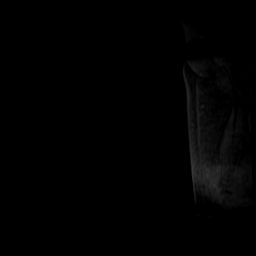
[im 72/144]
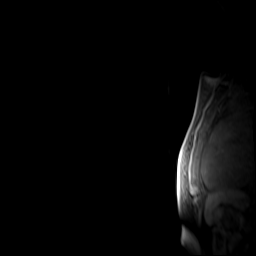
[im 144/144]
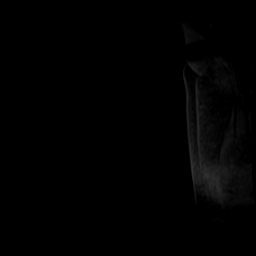

[Series 17: dynamic pre · axial · non-contrast · 1.3mm · 0.73mm/px · z∈[-86,+162]mm · 5 of 192 slices shown]
[im 1/192]
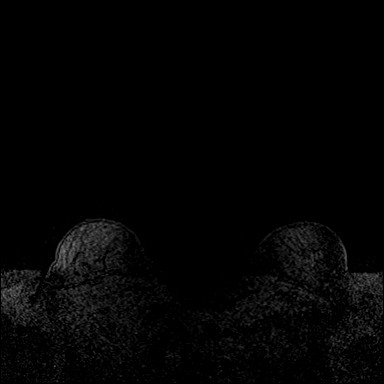
[im 48/192]
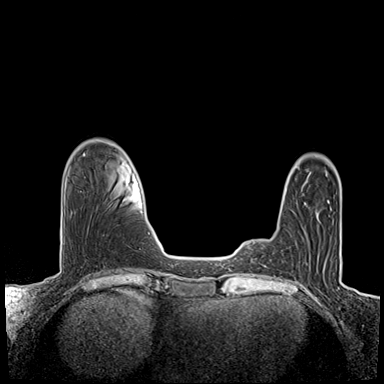
[im 96/192]
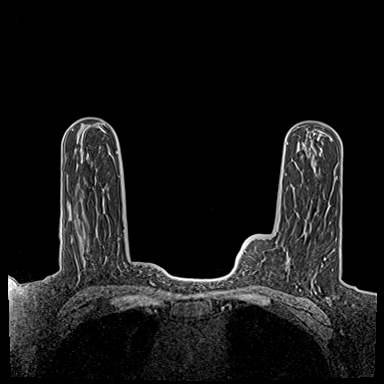
[im 144/192]
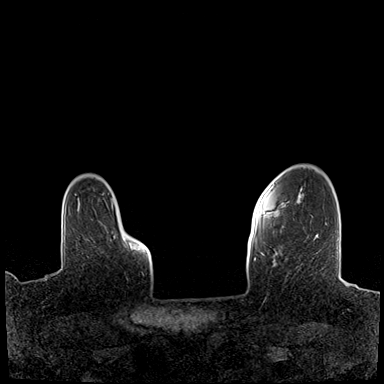
[im 192/192]
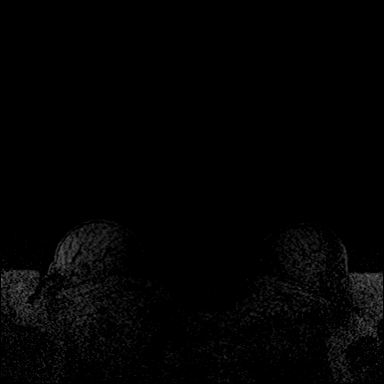

[Series 18: dynamic post 20 · axial · 1.3mm · 0.73mm/px · z∈[-86,+162]mm · 5 of 192 slices shown (1 of 2)]
[im 1/192]
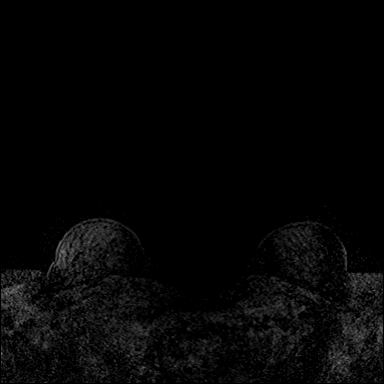
[im 48/192]
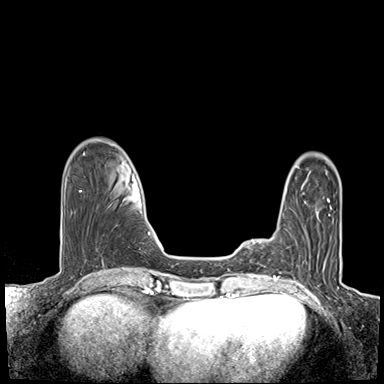
[im 96/192]
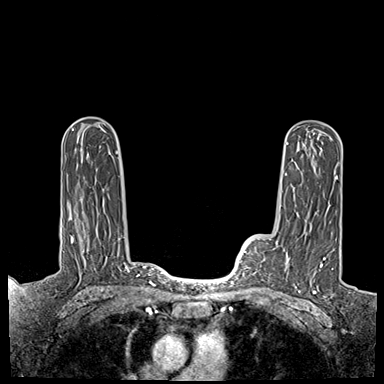
[im 144/192]
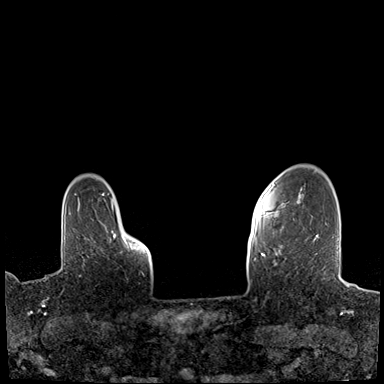
[im 192/192]
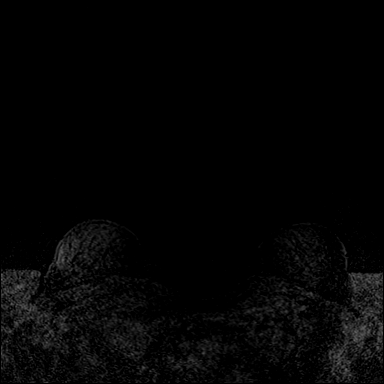

[Series 19: dynamic post 20 · axial · 1.3mm · 0.73mm/px · z∈[-86,+162]mm · 5 of 192 slices shown (2 of 2)]
[im 1/192]
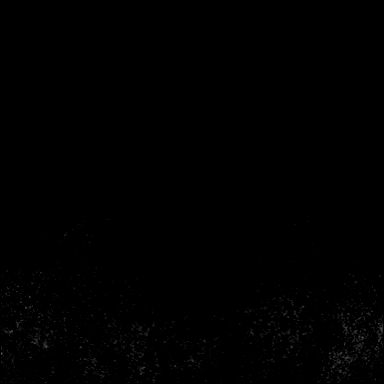
[im 48/192]
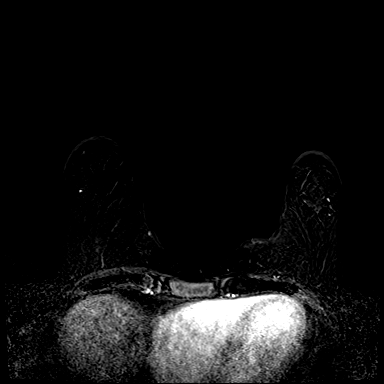
[im 96/192]
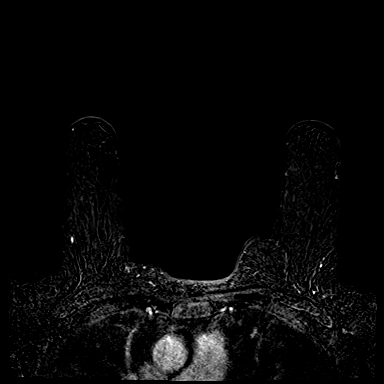
[im 144/192]
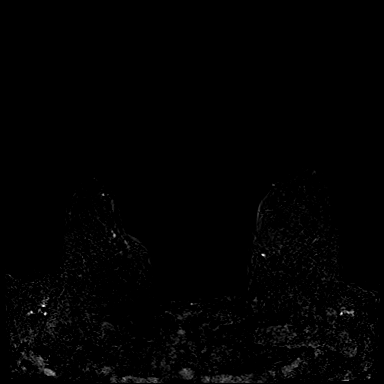
[im 192/192]
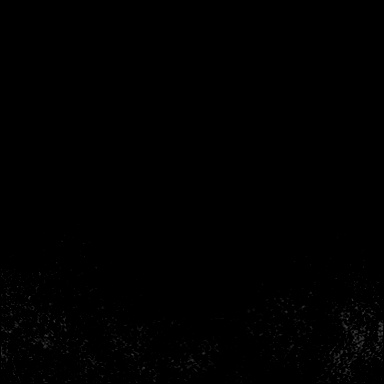

[Series 20: dynamic post 3 · axial · 1.3mm · 0.73mm/px · z∈[-86,+162]mm · 5 of 192 slices shown (1 of 2)]
[im 1/192]
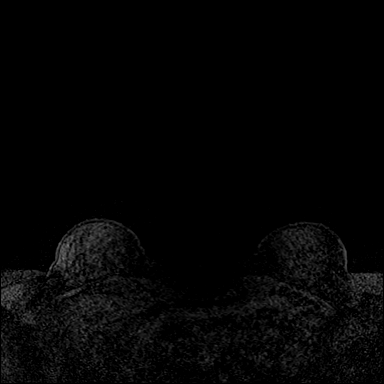
[im 48/192]
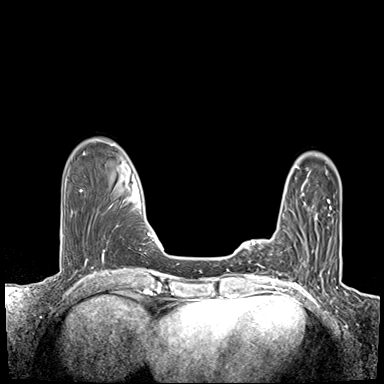
[im 96/192]
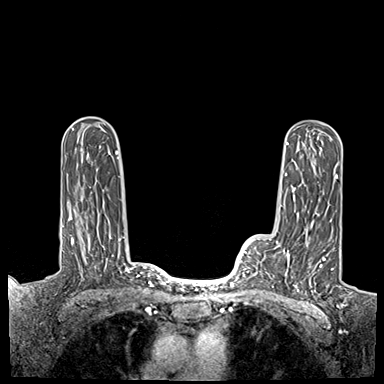
[im 144/192]
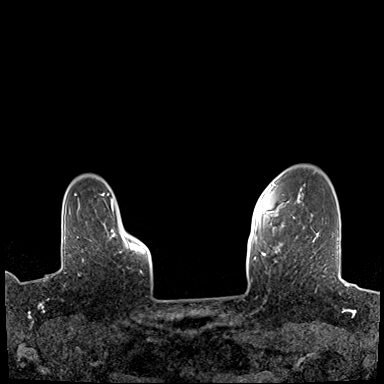
[im 192/192]
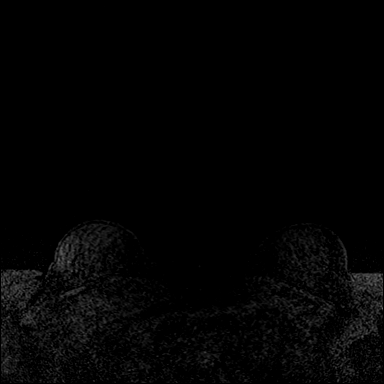

[Series 21: dynamic post 3 · axial · 1.3mm · 0.73mm/px · z∈[-86,+162]mm · 5 of 192 slices shown (2 of 2)]
[im 1/192]
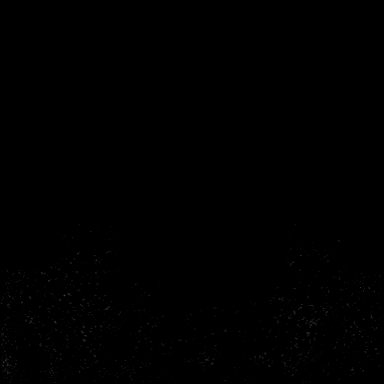
[im 48/192]
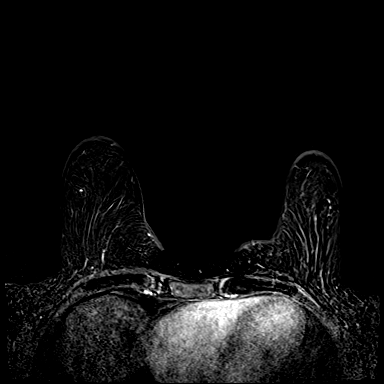
[im 96/192]
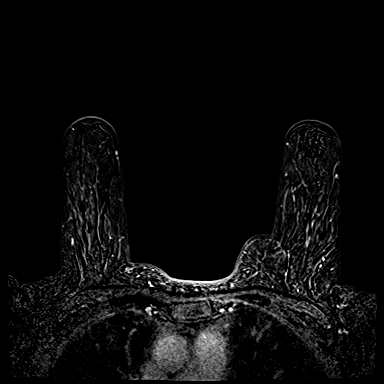
[im 144/192]
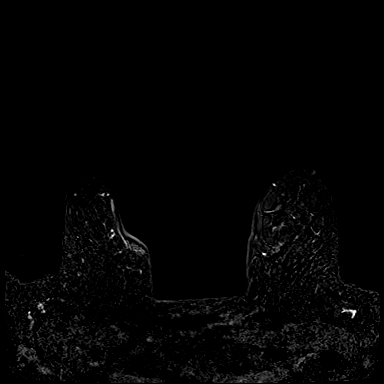
[im 192/192]
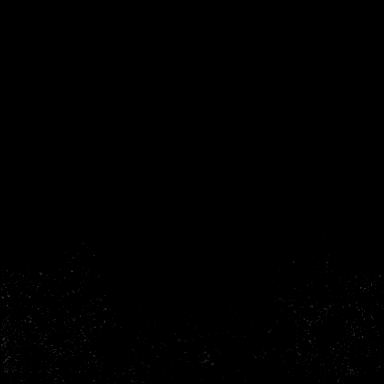

[Series 22: needle confirmation · axial · 1.3mm · 0.73mm/px · z∈[-86,-25]mm · 2 of 192 slices shown]
[im 1/192]
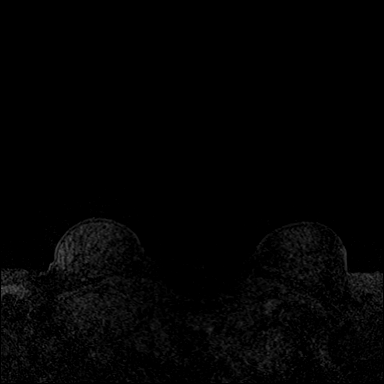
[im 48/192]
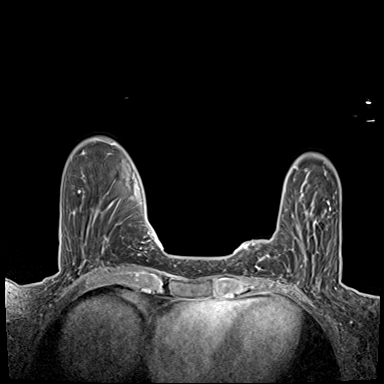

[30 of 48 positions shown; findings below may reference images not displayed]

FINDINGS: I met with the patient, and we discussed the procedure of MRI guided
biopsy, including risks, benefits, and alternatives. Specifically,
we discussed the risks of infection, bleeding, tissue injury, clip
migration, and inadequate sampling. Informed, written consent was
given. The usual time out protocol was performed immediately prior
to the procedures.

SITE 1: 2.6 CM AREA OF NON MASS ENHANCEMENT IN THE LATERAL MID LEFT
BREAST

Using sterile technique, 1% Lidocaine, MRI guidance, and a 9 gauge
vacuum assisted device, biopsy was performed of the recently
demonstrated 2.6 cm area of non mass enhancement in the lateral mid
left breast using a lateral approach. At the conclusion of the
procedure, a a cylinder shaped tissue marker clip was deployed into
the biopsy cavity. Follow-up 2-view mammogram was performed and
dictated separately.

SITE 2: 1.7 CM AREA OF NON MASS ENHANCEMENT IN THE LATERAL MID RIGHT
BREAST

Using sterile technique, 1% Lidocaine, MRI guidance, and a 9 gauge
vacuum assisted device, biopsy was performed of the recently
demonstrated 1.7 cm area of non mass enhancement in the lateral mid
right breast using a lateral approach. At the conclusion of the
procedure, a a cylinder shaped tissue marker clip was deployed into
the biopsy cavity. Follow-up 2-view mammogram was performed and
dictated separately.
IMPRESSION: MRI guided biopsy of a 2.6 cm area of non mass enhancement in the
lateral mid left breast and a 1.7 cm area of non mass enhancement in
the lateral mid right breast. No apparent complications.

ADDENDUM:
Pathology revealed PSEUDOANGIOMATOUS STROMAL HYPERPLASIA- NO
MALIGNANCY IDENTIFIED of the LEFT breast, lateral (cylinder clip).
This was found to be concordant by Dr. EHRHARDT.

Pathology revealed FIBROCYSTIC CHANGES AND PSEUDOANGIOMATOUS STROMAL
HYPERPLASIA- NO MALIGNANCY IDENTIFIED of the RIGHT breast, lateral
(cylinder clip). This was found to be concordant by Dr. EHRHARDT.

Pathology results were discussed with the patient by telephone. The
patient reported doing well after the biopsies with tenderness at
the sites. Post biopsy instructions and care were reviewed and
questions were answered. The patient was encouraged to call The

RECOMMENDATION:

1. Bilateral breast MRI in 6 months per protocol.
2. Annual screening mammography.

Pathology results reported by EHRHARDT RN on [DATE].

*** End of Addendum ***
FINDINGS: I met with the patient, and we discussed the procedure of MRI guided
biopsy, including risks, benefits, and alternatives. Specifically,
we discussed the risks of infection, bleeding, tissue injury, clip
migration, and inadequate sampling. Informed, written consent was
given. The usual time out protocol was performed immediately prior
to the procedures.

SITE 1: 2.6 CM AREA OF NON MASS ENHANCEMENT IN THE LATERAL MID LEFT
BREAST

Using sterile technique, 1% Lidocaine, MRI guidance, and a 9 gauge
vacuum assisted device, biopsy was performed of the recently
demonstrated 2.6 cm area of non mass enhancement in the lateral mid
left breast using a lateral approach. At the conclusion of the
procedure, a a cylinder shaped tissue marker clip was deployed into
the biopsy cavity. Follow-up 2-view mammogram was performed and
dictated separately.

SITE 2: 1.7 CM AREA OF NON MASS ENHANCEMENT IN THE LATERAL MID RIGHT
BREAST

Using sterile technique, 1% Lidocaine, MRI guidance, and a 9 gauge
vacuum assisted device, biopsy was performed of the recently
demonstrated 1.7 cm area of non mass enhancement in the lateral mid
right breast using a lateral approach. At the conclusion of the
procedure, a a cylinder shaped tissue marker clip was deployed into
the biopsy cavity. Follow-up 2-view mammogram was performed and
dictated separately.
IMPRESSION: MRI guided biopsy of a 2.6 cm area of non mass enhancement in the
lateral mid left breast and a 1.7 cm area of non mass enhancement in
the lateral mid right breast. No apparent complications.

## 2021-05-06 IMAGING — MG MM BREAST LOCALIZATION CLIP
6 series · 6 of 18 positions shown · non-contrast
Comparison: Previous exam(s).

CLINICAL DATA: Status post MR guided core needle biopsies of a
cm area of non mass enhancement lateral mid right breast and a
cm similar area in the lateral mid left breast.

EXAM:
3D DIAGNOSTIC BILATERAL MAMMOGRAM POST MRI BIOPSY X 2

[L CC synth-2D (1 of 2)]
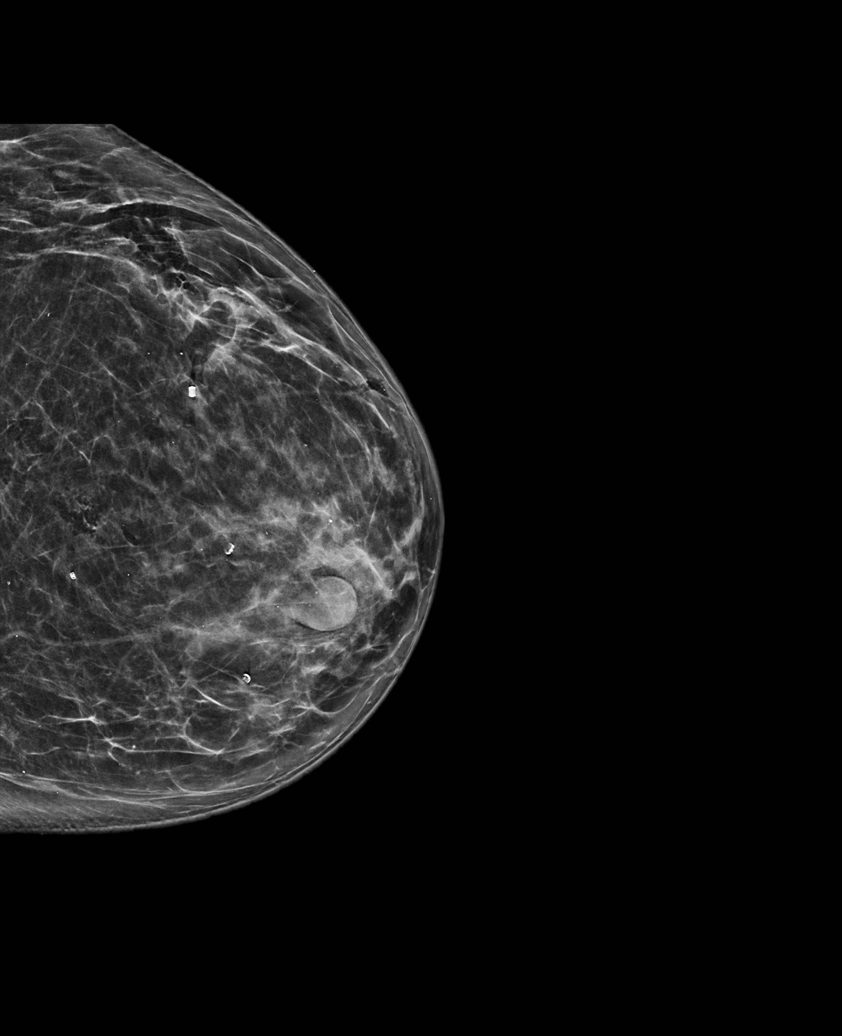

[L ML synth-2D]
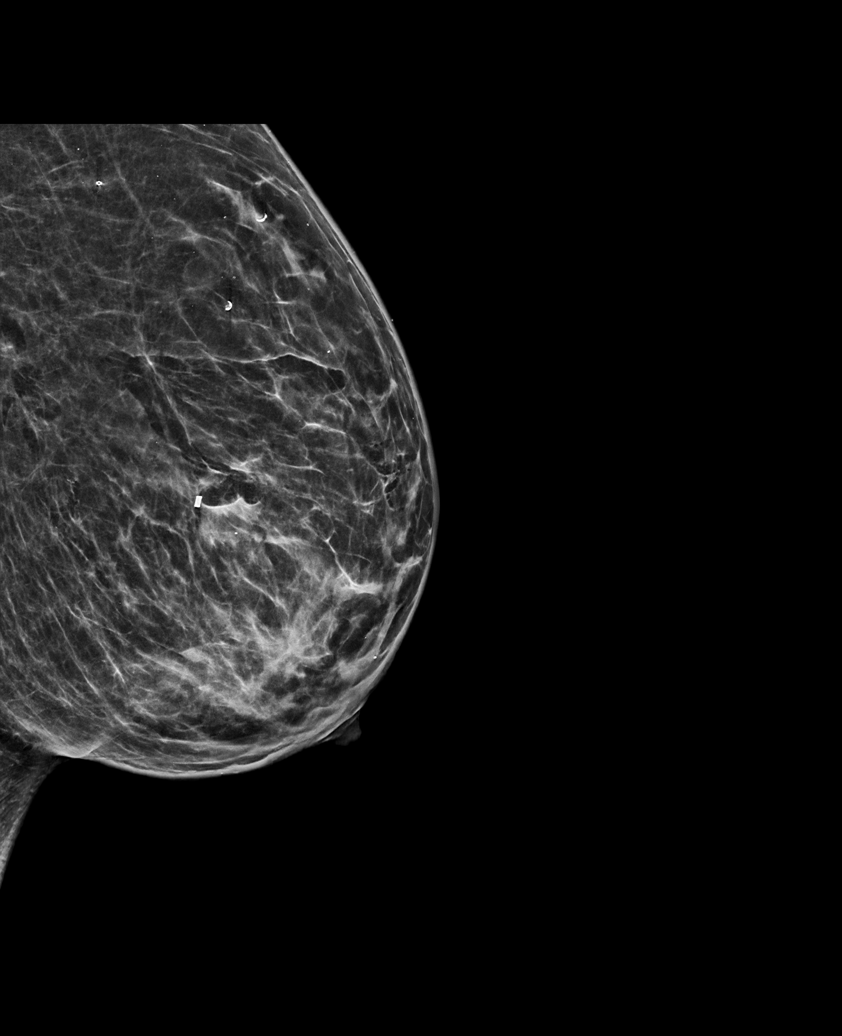

[L CC synth-2D (2 of 2)]
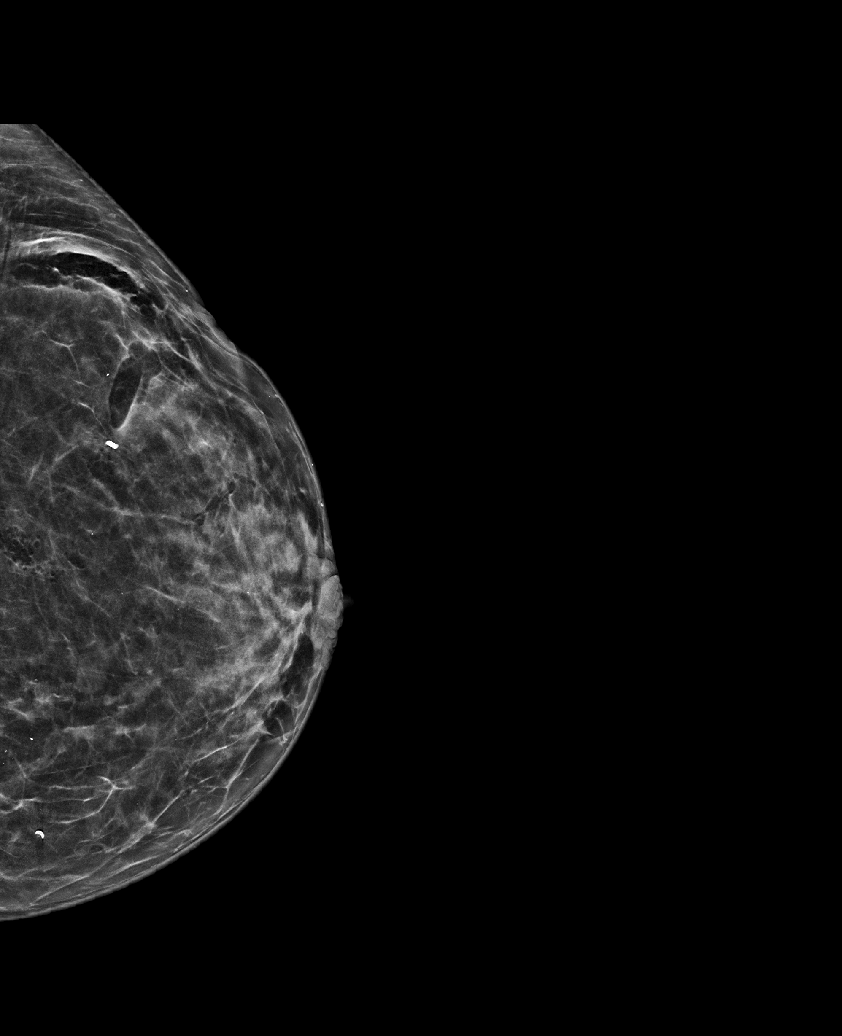

[L ML tomo · tomo slice 33/64.0]
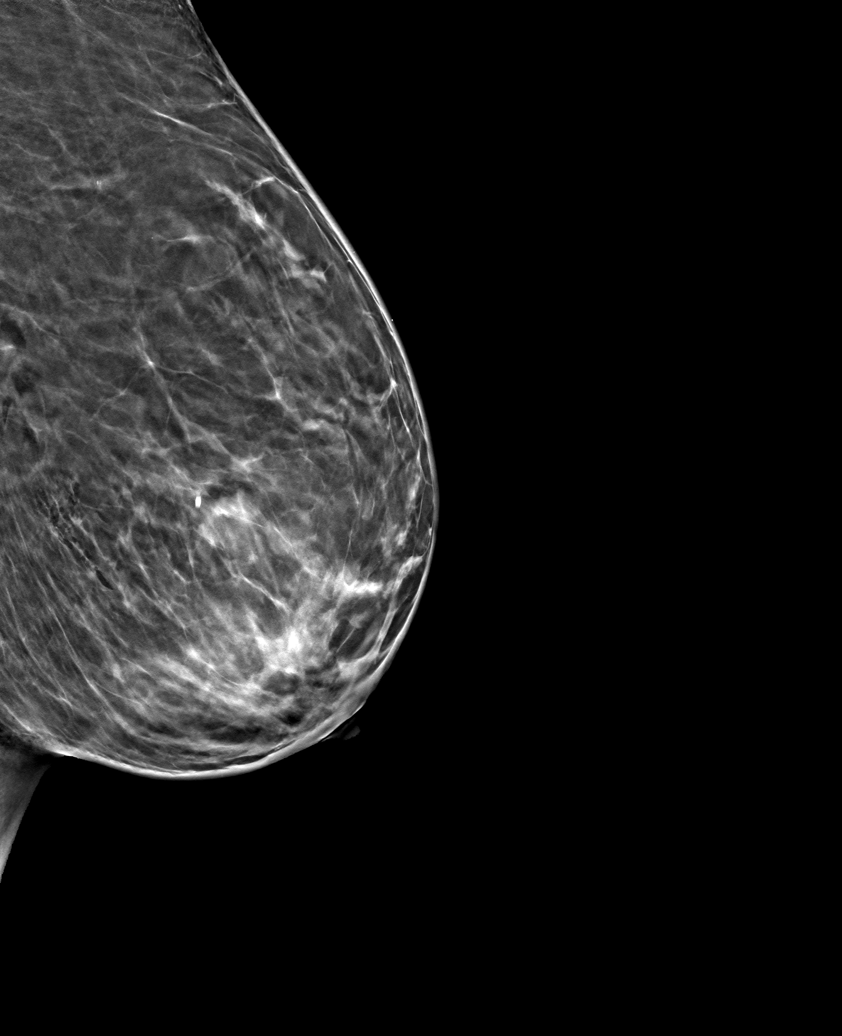

[L CC tomo (1 of 2) · tomo slice 35/68.0]
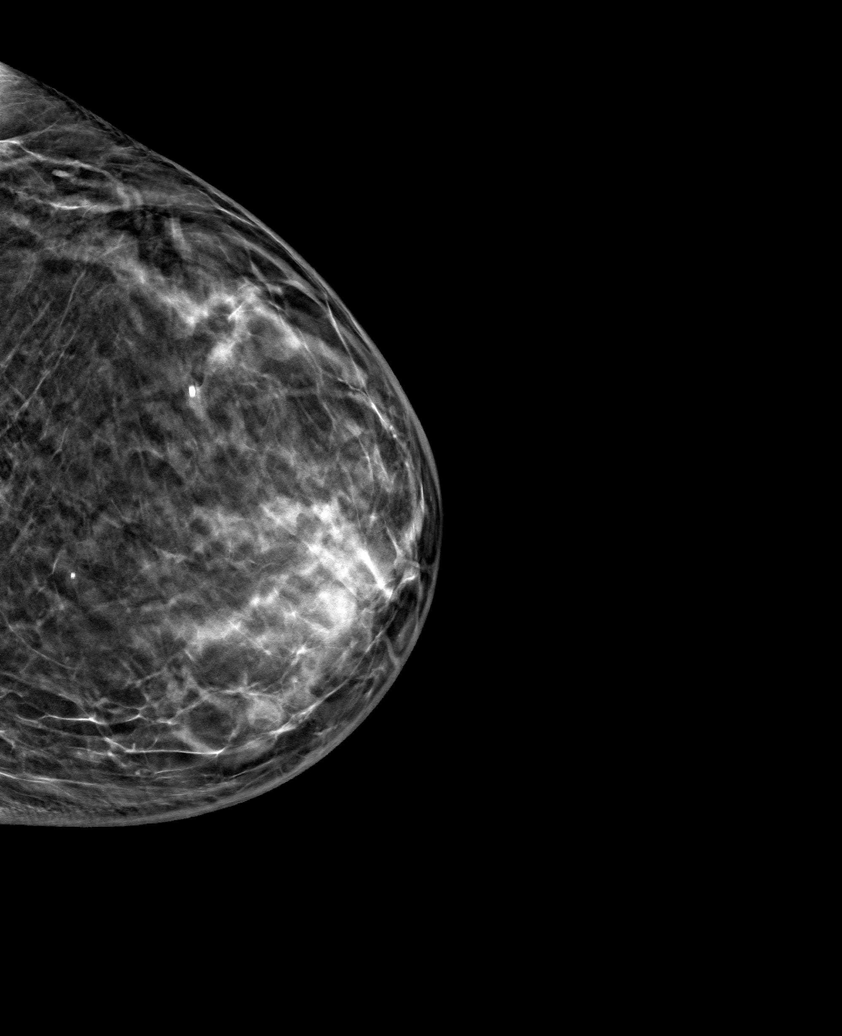

[L CC tomo (2 of 2) · tomo slice 31/62.0]
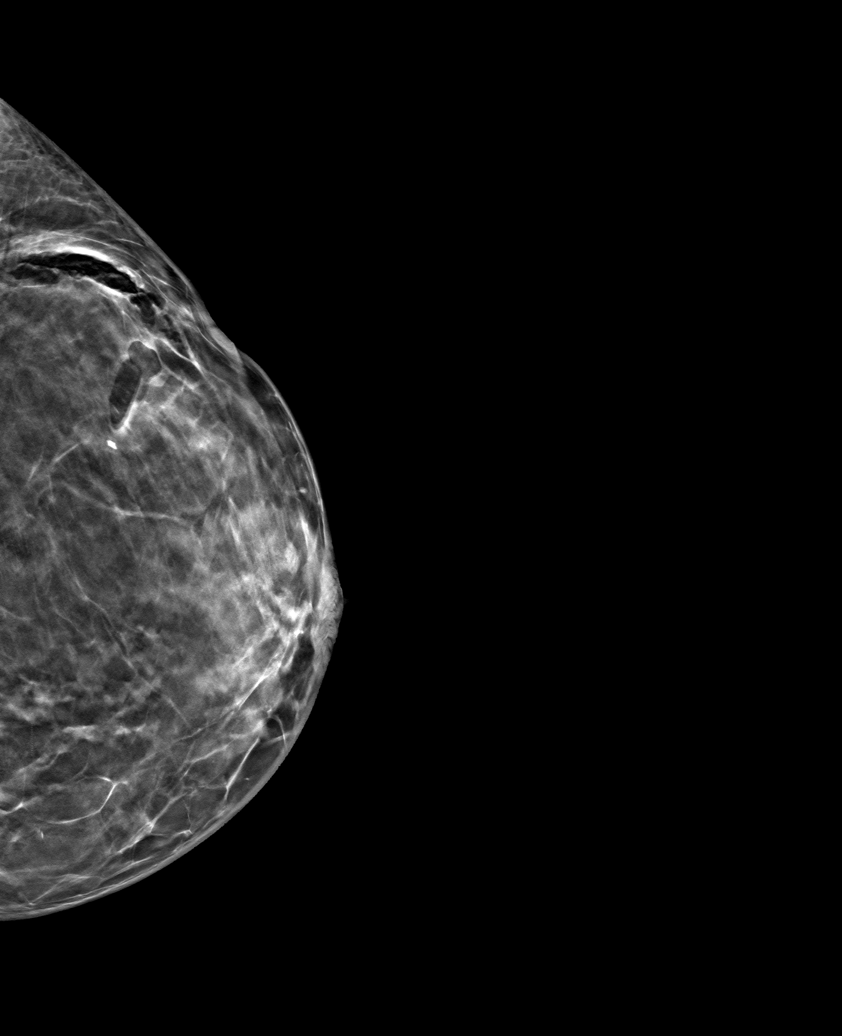

[6 of 18 positions shown; findings below may reference images not displayed]

FINDINGS: 3D Mammographic images were obtained following MR guided biopsy of
non mass enhancement in the lateral midportion of each breast. The
biopsy marking clips are in expected positions at the sites of
biopsy.
IMPRESSION: Appropriate positioning of the cylinder shaped biopsy marking clip
at the site of biopsy in the outer mid left breast and appropriate
positioning of the cylinder-shaped biopsy marker clip at the site of
biopsy in the outer mid right breast.

Final Assessment: Post Procedure Mammograms for Marker Placement

## 2021-05-06 MED ORDER — GADOBUTROL 1 MMOL/ML IV SOLN
9.0000 mL | Freq: Once | INTRAVENOUS | Status: AC | PRN
  Administered 2021-05-06: 9 mL via INTRAVENOUS

## 2021-05-10 ENCOUNTER — Telehealth: Payer: Self-pay | Admitting: Medical

## 2021-05-10 NOTE — Telephone Encounter (Signed)
Schedule follow up visit from last appt.   I reviewed her neurology notes today

## 2021-05-13 ENCOUNTER — Encounter: Payer: Self-pay | Admitting: Medical

## 2021-05-13 ENCOUNTER — Other Ambulatory Visit: Payer: Self-pay

## 2021-05-13 ENCOUNTER — Ambulatory Visit: Payer: 59 | Admitting: Medical

## 2021-05-13 VITALS — BP 108/62 | HR 63 | Ht 64.25 in | Wt 163.4 lb

## 2021-05-13 DIAGNOSIS — I493 Ventricular premature depolarization: Secondary | ICD-10-CM

## 2021-05-13 DIAGNOSIS — R4586 Emotional lability: Secondary | ICD-10-CM | POA: Diagnosis not present

## 2021-05-13 DIAGNOSIS — G250 Essential tremor: Secondary | ICD-10-CM

## 2021-05-13 DIAGNOSIS — Z9884 Bariatric surgery status: Secondary | ICD-10-CM

## 2021-05-13 DIAGNOSIS — F419 Anxiety disorder, unspecified: Secondary | ICD-10-CM

## 2021-05-13 NOTE — Progress Notes (Signed)
Subjective:  Latasha Ramirez is a 50 y.o. female who presents for Chief Complaint  Patient presents with   Obesity    Medication check for weight loss     Patient Care Team: Savian Mazon, Cleda Mccreedy as PCP - General (Family Medicine) Van Clines, MD as Consulting Physician (Neurology) Sees dentist Sees eye doctor, [facility] Dr. Truett Mainland, cardiology Dr. Genia Harold, GI Dr. Rana Snare, [facility]   Concerns: Here to recheck.    She saw neurology recently. Neurology notes advised she follow up here with PCP regarding anxiety.    She notes having some stressful work situations.  She is a Research officer, political party for health clinic.  About a year ago when she took the job she was unaware that one of her employees had tried out for that position and did not feel they could handle the stress.  However that employee all this year seem to have a personal vendetta against her and the health clinic in general.  This employee would daily be argumentative or difficult to work with.  The employee would accuse her of harassment.  Human resources got involved multiple times over the past year to try to work with this employee.   After a whole year of dealing with the stress of a difficult employee this employee quit recently in the early part of November.  Latasha Ramirez notes that immediately it seem like the tone and mood in her department got better.  Nevertheless it has been a long difficult year dealing with a stressful employee  She notes that place that she deals with stress includes doing nail care.  She likes to paint her nails and do special things her nails.  She likes to knit.  She exercises and does other things to help decrease stress.  She has not reached out to counseling but she is fully aware of employee assistance program  Other big stressors include a recent scare with a mammogram and ultimate MRI breast and biopsies.  The biopsies fortunately came back negative.   she has to do a  repeat MRI breast in 6 months.  The stress of waiting on test results was a lot.  She has also had genetic testing for breast cancer gene.  She does have some risk.  She has been seeing neurology for tremor and history of questionable syncope that was thought to be seizure a few years ago.  However she has never seen the results of an EEG that she had once.  She has not had a recent EEG but would like one.  She has not tolerated medications for seizures very well  She recently was changed to a different medication by neurology  She would like to try medicine to help with mood and anxiety but she has not tolerated several medicines related to mood in the past either.  She absolutely will not take any medicine that is going to cause weight gain.  She has worked so hard to get some weight off in the past year  Prior medicines that she did not tolerate include Zoloft Paxil and Lexapro due to heart palpitations.  She recently was prescribed Xanax to calm her nerves with this recent breast biopsy.  She notes that worked very well for her  Stress coping skills - does her nails as way to deal with stress, enjoys this.    Does knitting.  Has to do something with hands, focusing,   Started working [employer] 05/2020, was employee who reported  to her.  She took position she is in, worked 3 weeks, decided too much for her.  No other aggravating or relieving factors.    No other c/o.  The following portions of the patient's history were reviewed and updated as appropriate: allergies, current medications, past family history, past medical history, past social history, past surgical history and problem list.  ROS Otherwise as in subjective above  Objective: BP 108/62 (BP Location: Right Arm, Patient Position: Sitting)   Pulse 63   Ht 5' 4.25" (1.632 m)   Wt 163 lb 6.4 oz (74.1 kg)   SpO2 99%   BMI 27.83 kg/m   Wt Readings from Last 3 Encounters:  05/13/21 163 lb 6.4 oz (74.1 kg)  04/28/21 162 lb  (73.5 kg)  01/22/21 161 lb (73 kg)    General appearance: alert, no distress, well developed, well nourished Psych: Pleasant, good eye contact, answers questions appropriately    Assessment: Encounter Diagnoses  Name Primary?   Anxiety Yes   Mood change    PVC (premature ventricular contraction)    H/O gastric bypass    Essential tremor      Plan: I reviewed over her recent neurology notes.  They had made reference to her anxiety.  We discussed her anxiety today.    We discussed ways to deal with stress and anxiety. I recommend regular exercise such as 30 minutes or more most days of the week such as walking running and bicycling I recommend taking some time to meditate or pray daily to help slow racing thoughts. I recommend working on relaxation techniques such as deep breathing exercises in a comfortable position relaxing your body.  There are free Apps on the smart phone for this for example Consider getting a massage Journal or use diary to express your ideas on paper to cope with anxiety and stress Work on time management, use a calendar or plan out things to avoid stressing about things. Find ways to utilize your time to include exercise and personal "me" time. Some people use aromatherapy such as lavender to relax Some people use herbal teas to help calm their mood Spend some time with animals or your pet if you have one Consider seeing a counselor to help deal with anxiety and work on specific techniques  I will consult with her neurologist about some medication options.  She has not tolerated several medicines in the past so we need to keep in mind prior interactions and side effects, and she actually does not want to see weight gain on the medication.  Advise she follow-up with neurology as planned    Addendum: After consulting with her neurologist, we will avoid Wellbutrin for now just for the potential of lowering the threshold for seizure.  I will try her on some  clonazepam as needed.  We discussed this medicine when she was here.  Discussed risk and benefits of medication and proper use of medication.  Latasha Ramirez was seen today for obesity.  Diagnoses and all orders for this visit:  Anxiety  Mood change  PVC (premature ventricular contraction)  H/O gastric bypass  Essential tremor   Follow up: pending call back

## 2021-05-14 ENCOUNTER — Other Ambulatory Visit (HOSPITAL_COMMUNITY): Payer: Self-pay

## 2021-05-14 ENCOUNTER — Telehealth: Payer: Self-pay | Admitting: Medical

## 2021-05-14 MED ORDER — CLONAZEPAM 0.5 MG PO TABS
0.5000 mg | ORAL_TABLET | Freq: Two times a day (BID) | ORAL | 0 refills | Status: DC | PRN
  Filled 2021-05-14: qty 20, 10d supply, fill #0

## 2021-05-14 NOTE — Telephone Encounter (Signed)
Patient notified, states she would rather not try Clonazepam but knows it is at pharmacy if she needs it, scheduled in person follow up per patient request in December

## 2021-05-14 NOTE — Telephone Encounter (Signed)
Spoke with neurology today.  Wellbutrin may or may not be an option.  It can lower threshold potential for seizure.  For now I did prescribe clonazepam.  This is like Xanax but a little bit different medication.  You can either start at 1/2 tablet as needed.  Do not use this more than twice a day.  Preferably only use as needed not every day.  We do need to do a follow-up in a few weeks which can be virtual to discuss.  Lets see how you do with this along with other general lifestyle measures to reduce anxiety likely talked about  consider counseling

## 2021-05-17 ENCOUNTER — Encounter: Payer: Self-pay | Admitting: Neurology

## 2021-05-24 ENCOUNTER — Encounter: Payer: Self-pay | Admitting: Neurology

## 2021-05-24 DIAGNOSIS — R102 Pelvic and perineal pain: Secondary | ICD-10-CM | POA: Diagnosis not present

## 2021-05-24 DIAGNOSIS — Z803 Family history of malignant neoplasm of breast: Secondary | ICD-10-CM | POA: Diagnosis not present

## 2021-06-07 ENCOUNTER — Encounter: Payer: Self-pay | Admitting: Internal Medicine

## 2021-06-09 ENCOUNTER — Telehealth: Payer: 59 | Admitting: Medical

## 2021-06-09 ENCOUNTER — Other Ambulatory Visit: Payer: Self-pay

## 2021-06-09 ENCOUNTER — Encounter: Payer: Self-pay | Admitting: Medical

## 2021-06-09 VITALS — BP 98/58 | HR 86 | Wt 163.4 lb

## 2021-06-09 DIAGNOSIS — T50905A Adverse effect of unspecified drugs, medicaments and biological substances, initial encounter: Secondary | ICD-10-CM

## 2021-06-09 DIAGNOSIS — F419 Anxiety disorder, unspecified: Secondary | ICD-10-CM | POA: Insufficient documentation

## 2021-06-09 DIAGNOSIS — Z566 Other physical and mental strain related to work: Secondary | ICD-10-CM | POA: Diagnosis not present

## 2021-06-09 DIAGNOSIS — R197 Diarrhea, unspecified: Secondary | ICD-10-CM | POA: Insufficient documentation

## 2021-06-09 NOTE — Progress Notes (Signed)
This visit type was conducted due to national recommendations for restrictions regarding the COVID-19 Pandemic (e.g. social distancing) in an effort to limit this patient's exposure and mitigate transmission in our community.  Due to their co-morbid illnesses, this patient is at least at moderate risk for complications without adequate follow up.  This format is felt to be most appropriate for this patient at this time.    Documentation for virtual audio and video telecommunications through [place] encounter:  The patient was located at home. The provider was located in the office. The patient did consent to this visit and is aware of possible charges through their insurance for this visit.  The other persons participating in this telemedicine service were none. Time spent on call was 15 minutes and in review of previous records >20 minutes total.  This virtual service is not related to other E/M service within previous 7 days.     Subjective:  Latasha Ramirez is a 50 y.o. female who presents for Chief Complaint  Patient presents with   anxiety follow-up    Anxiety follow-up      Patient Care Team: Halo Laski, Cleda Mccreedy as PCP - General (Family Medicine) Van Clines, MD as Consulting Physician (Neurology) Sees dentist Sees eye doctor, [facility] Dr. Truett Mainland, cardiology Dr. Genia Harold, GI Dr. Rana Snare, [facility]   Concerns: Virtual consult  Last visit we discussed her acute anxiety issues.  Since last visit the work stress has dramatically decreased where she does not feel anxious now.  She only used clonazepam twice since last visit.  She is doing pretty good at current  Her only issue is diarrhea.  She has been working with neurology to come of Zonsimade and change back to Trokendi.  She seems to have had diarrhea problems since starting Zonesimide .    She got her flu shot at work recently  No other aggravating or relieving factors.    No other  c/o.  The following portions of the patient's history were reviewed and updated as appropriate: allergies, current medications, past family history, past medical history, past social history, past surgical history and problem list.  ROS Otherwise as in subjective above  Objective: BP (!) 98/58    Pulse 86    Wt 163 lb 6.4 oz (74.1 kg)    BMI 27.83 kg/m   Virtual consult today not examined in person General appearance: alert, no distress, well developed, well nourished Psych: Pleasant, good eye contact, answers questions appropriately    Assessment: Encounter Diagnoses  Name Primary?   Adverse effect of drug, initial encounter Yes   Work stress    Anxiety    Diarrhea, unspecified type      Plan: We discussed risk and concerns.  Since the work issue that was a special situation has resolved, her anxiety has been much less.  She only used clonazepam 2 times since last visit.  Overall doing much better.  Continue good coping skills, regular exercise and avoidance of stress where possible.  Work stress-continue efforts to moderate stress with healthy coping skills  Diarrhea likely related to recent medication zonisamide.  She is switching back to Trokendi XR tonight per neurology.  Follow-up with neurology as planned   Latasha Ramirez was seen today for anxiety follow-up.  Diagnoses and all orders for this visit:  Adverse effect of drug, initial encounter  Work stress  Anxiety  Diarrhea, unspecified type   Follow up: prn

## 2021-07-05 ENCOUNTER — Other Ambulatory Visit: Payer: Self-pay | Admitting: Medical

## 2021-07-05 MED FILL — Nebivolol HCl Tab 5 MG (Base Equivalent): ORAL | 60 days supply | Qty: 60 | Fill #3 | Status: AC

## 2021-07-06 ENCOUNTER — Other Ambulatory Visit (HOSPITAL_COMMUNITY): Payer: Self-pay

## 2021-07-06 MED ORDER — OMEPRAZOLE 40 MG PO CPDR
40.0000 mg | DELAYED_RELEASE_CAPSULE | Freq: Every day | ORAL | 0 refills | Status: DC
  Filled 2021-07-06: qty 90, 90d supply, fill #0

## 2021-07-14 ENCOUNTER — Encounter: Payer: Self-pay | Admitting: Internal Medicine

## 2021-07-21 NOTE — Telephone Encounter (Signed)
Pt got flu shot done in September through [facility] health

## 2021-07-29 ENCOUNTER — Ambulatory Visit: Payer: 59 | Admitting: Neurology

## 2021-07-29 ENCOUNTER — Other Ambulatory Visit: Payer: Self-pay

## 2021-07-29 ENCOUNTER — Other Ambulatory Visit (HOSPITAL_COMMUNITY): Payer: Self-pay

## 2021-07-29 ENCOUNTER — Encounter: Payer: Self-pay | Admitting: Neurology

## 2021-07-29 VITALS — BP 110/75 | HR 93 | Ht 64.0 in | Wt 173.0 lb

## 2021-07-29 DIAGNOSIS — G4486 Cervicogenic headache: Secondary | ICD-10-CM | POA: Diagnosis not present

## 2021-07-29 DIAGNOSIS — Z87898 Personal history of other specified conditions: Secondary | ICD-10-CM | POA: Diagnosis not present

## 2021-07-29 DIAGNOSIS — G250 Essential tremor: Secondary | ICD-10-CM

## 2021-07-29 MED ORDER — TOPIRAMATE ER 100 MG PO CAP24
1.0000 | ORAL_CAPSULE | Freq: Every day | ORAL | 3 refills | Status: DC
  Filled 2021-07-29 – 2021-08-18 (×2): qty 90, 90d supply, fill #0
  Filled 2021-10-19 – 2021-11-14 (×2): qty 90, 90d supply, fill #1

## 2021-07-29 MED ORDER — PRIMIDONE 50 MG PO TABS
50.0000 mg | ORAL_TABLET | Freq: Every day | ORAL | 3 refills | Status: DC
  Filled 2021-07-29 – 2021-10-19 (×2): qty 90, 90d supply, fill #0

## 2021-07-29 MED ORDER — PROPRANOLOL HCL 20 MG PO TABS
20.0000 mg | ORAL_TABLET | Freq: Every day | ORAL | 3 refills | Status: DC
  Filled 2021-07-29: qty 90, 90d supply, fill #0
  Filled 2021-10-19: qty 90, 90d supply, fill #1

## 2021-07-29 NOTE — Patient Instructions (Signed)
Start the low dose Propranolol 20mg : take 1 tablet every night. I will update you about the Bystolic after contacting Dr. . If no side effects in a month, we can consider increasing dose as tolerated.  2. Continue Trokendi XR 100mg  every night and Primidone 50mg  every night  3. Follow-up in 4-5 months, call for any changes.   Seizure Precautions: 1. If medication has been prescribed for you to prevent seizures, take it exactly as directed.  Do not stop taking the medicine without talking to your doctor first, even if you have not had a seizure in a long time.   2. Avoid activities in which a seizure would cause danger to yourself or to others.  Don't operate dangerous machinery, swim alone, or climb in high or dangerous places, such as on ladders, roofs, or girders.  Do not drive unless your doctor says you may.  3. If you have any warning that you may have a seizure, lay down in a safe place where you can't hurt yourself.    4.  No driving for 6 months from last seizure, as per [facility].   Please refer to the following link on the Epilepsy Foundation of America's website for more information: http://www.epilepsyfoundation.org/answerplace/Social/driving/drivingu.cfm   5.  Maintain good sleep hygiene. Avoid alcohol.  6.  Contact your doctor if you have any problems that may be related to the medicine you are taking.  7.  Call 911 and bring the patient back to the ED if:        A.  The seizure lasts longer than 5 minutes.       B.  The patient doesn't awaken shortly after the seizure  C.  The patient has new problems such as difficulty seeing, speaking or moving  D.  The patient was injured during the seizure  E.  The patient has a temperature over 102 F (39C)  F.  The patient vomited and now is having trouble breathing

## 2021-07-29 NOTE — Progress Notes (Signed)
NEUROLOGY FOLLOW UP OFFICE NOTE  Latasha Ramirez [id number] 06-01-1971  HISTORY OF PRESENT ILLNESS: I had the pleasure of seeing Latasha Ramirez in follow-up in the neurology clinic on 07/29/2021.  The patient was last seen 3 months ago for seizures, essential tremor, and cervicogenic headaches. She is alone in the office today. On her last visit, she reported word-finding difficulties from Trokendi XR and was switched to Zonisamide. She initially reported she felt much "clearer" on Zonisamide, however 3 weeks later reported she continued to have diarrhea daily and was instructed to stop Zonisamide in 04/2021. She reports the diarrhea is better off Zonisamide. She continues on Trokendi XR 100mg  daily for seizure and headache prophylaxis, as well as for essential tremor. She is on Primidone 50mg  qhs for tremor as well, she had side effects on higher dose. She continues to have "the shakes." She is interested in starting a different medication for essential tremor. She denies any seizures since 2011, no vasovagal syncope since 2020. She denies any significant headaches, mostly dealing with sinus issues. She is on Bystolic 5mg  daily for PVCs.    History on Initial Assessment 09/23/2020: This is a 51 year old right-handed woman with a history of hypotension, gastric bypass, essential tremor, and seizures, presenting to establish care.  1. Seizures. The first seizure occurred at age 29, it was unwitnessed with no prior warning symptoms, she woke up on the bathroom floor with drool on her arm. She denies any tongue bite or incontinence (prior neurology notes indicate there was tongue bite and incontinence with her first GTC in July 2011). She was started on Levetiracetam then switched to Topiramate in 2018 due to side effects of feeling "zombiefied." Prior to the GTC, she was having recurrent syncopal episodes where she would lose consciousness for 15 seconds, attributed to hypotension. They would usually occur when  taking a shower or getting up in the morning. She has had 4 episodes, last occurred 1.5 years ago while taking a hot shower, her hearing went and she had tunnel vision, she called for her husband and he lowered her to the floor. As soon as she went out, she woke up, no post-event confusion. She denies any staring/unresponsive episodes, olfactory/gustatory hallucinations, myoclonic jerks. She woke up last Saturday with numbness in her left 2 fingers which took several hours to feel normal. A maternal aunt and paternal cousin have seizures. Otherwise she had a normal birth and early development, no history of febrile convulsions, CNS infections, neurosurgical procedures, significant head injuries.   2. She has benign essential tremor, with family history of tremors in at least 2 maternal cousins. She has had tremors her whole life, more noticeable on the right hand. She notices it when carrying something, it occasionally affects handwriting. It does not affect eating/drinking or affect work. She was started on Primidone in 2016, taking 100mg  daily with good response. She feels it has affected her libido for the past 3-4 years.  3. Headaches. She has had headaches for several years with pain in the back of her head. No associated nausea/vomiting, photo/phonophobia. Headaches occur at least once a week, she takes Ibuprofen or Tylenol. She notices more pain when she wakes up in the morning, back of head "is just awful," with 5-6 over 10 pain, going down to a 1-2 with increase in Topiramate to 50mg  in AM, 100mg  in PM. She was having neck pain, she saw a chiropractor which has helped some with headaches, neck pain somewhat better. She has stretching  exercises.   4. Memory loss. She is concerned about dementia, she was a Engineer, maintenance (IT)tatistics major in college but now would play dominoes and have a hard time adding up scores. She has word-finding difficulties. She denies getting lost driving or missing medications. Bills are on  autopay. There is a history of dementia on her father's side.    Prior AEDs: Levetiracetam Laboratory Data:  EEGs: Done [facility] in [place] - normal wake and sleep EEG in 2019, 2017. Per notes, "a previous EEG (not available for review) was abnormal"  MRI: [place] as [place].  MRI brain 06/2020: No acute changes. I personally reviewed images, there is minimal scattered T2/FLAIR hyperintensities within the frontal predominant white matter. Hippocampi are symmetric and within normal limits.  PAST MEDICAL HISTORY: Past Medical History:  Diagnosis Date   Anxiety    GERD (gastroesophageal reflux disease)    History of bleeding ulcers    Hypotension    Seizures (HCC)    last seizure 2010.  last EEG 2015    MEDICATIONS: Current Outpatient Medications on File Prior to Visit  Medication Sig Dispense Refill   BYSTOLIC 5 MG tablet TAKE 1 TABLET (5 MG TOTAL) BY MOUTH DAILY. 90 tablet 3   clotrimazole-betamethasone (LOTRISONE) cream APPLY TO THE AFFECTED AND SURROUNDING AREAS OF SKIN BY TOPICAL ROUTE 2 TIMES PER DAY IN THE MORNING AND EVENING AS NEEDED 45 g 3   omeprazole (PRILOSEC) 40 MG capsule Take 1 capsule (40 mg total) by mouth daily. 90 capsule 0   primidone (MYSOLINE) 50 MG tablet Take 1 tablet (50 mg total) by mouth at bedtime. 90 tablet 3   Topiramate ER (TROKENDI XR) 100 MG CP24 Take 100 mg by mouth daily at night. (Take with Trokendi 50mg  capsule for total of 150mg  every night) 30 capsule 6   clonazePAM (KLONOPIN) 0.5 MG tablet Take 1 tablet (0.5 mg total) by mouth 2 (two) times daily as needed for anxiety. (Patient not taking: Reported on 07/29/2021) 20 tablet 0   Topiramate ER (TROKENDI XR) 50 MG CP24 Take 1 capsule every night (Take with Trokendi 100mg  capsule for total of 150mg  every night) (Patient not taking: Reported on 07/29/2021) 30 capsule 6   No current facility-administered medications on file prior to visit.     ALLERGIES: Allergies  Allergen Reactions   Other Rash    Foam tape cause blisters / all adhesives cause contact rash    Adhesive [Tape]    Flexeril [Cyclobenzaprine]     Palpitations on Flexeril and Skelaxin    Levaquin [Levofloxacin]     Diarrhea, GI upset   Zoloft [Sertraline Hcl]     Heart palpitations, PVCs.  Including adverse reaction to zoloft, paxil, lexapro   Zonisamide     Moderate diarrhea   Sulfa Antibiotics Rash    FAMILY HISTORY: Family History  Problem Relation Age of Onset   Ovarian cancer Mother    Cancer Mother [id number]       ovarian   Heart disease Father [id number]       died with MI   Diabetes Father    Diabetes Maternal Grandmother    Suicidality Maternal Grandfather    Diabetes Paternal Grandmother    Skin cancer Paternal Grandfather    Cancer Paternal Aunt        breast   Cancer Paternal Uncle        bladder   Cancer Paternal Aunt  breast    SOCIAL HISTORY: Social History   Socioeconomic History   Marital status: Divorced    Spouse name: Not on file   Number of children: 1   Years of education: Not on file   Highest education level: Not on file  Occupational History   Not on file  Tobacco Use   Smoking status: Never   Smokeless tobacco: Never  Vaping Use   Vaping Use: Never used  Substance and Sexual Activity   Alcohol use: Yes    Comment: socially, rarely   Drug use: Never   Sexual activity: Not on file  Other Topics Concern   Not on file  Social History Narrative   Lives with significant other and has son in college   Exercise - walking, yoga, piliates, some weights   Right handed    Works as work site Materials engineer   10/30/20   Social Determinants of Corporate investment banker Strain: Not on file  Food Insecurity: Not on file  Transportation Needs: Not on file  Physical Activity: Not on file  Stress: Not on file  Social Connections: Not on file  Intimate Partner Violence: Not on file     PHYSICAL  EXAM: Vitals:   07/29/21 1059  BP: 110/75  Pulse: 93  SpO2: 98%   General: No acute distress Head:  Normocephalic/atraumatic Skin/Extremities: No rash, no edema Neurological Exam: alert and awake. No aphasia or dysarthria. Fund of knowledge is appropriate.  Attention and concentration are normal.   Cranial nerves: Pupils equal, round. Extraocular movements intact with no nystagmus. Visual fields full.  No facial asymmetry.  Motor: Bulk and tone normal, muscle strength 5/5 throughout with no pronator drift.   Finger to nose testing intact.  Gait narrow-based and steady, able to tandem walk adequately.  No resting or postural tremor. There is mild bilateral endpoint tremor. No cogwheeling, good finger taps.   IMPRESSION: This is a pleasant 51 yo RH hypotension, gastric bypass, essential tremor, seizures, and headaches suggestive of cervicogenic headaches. No seizures since 2011, she has a history of vasovagal syncope, last episode was in 2020. She is on a lower dose of Trokendi XR 100mg  daily for seizure and headache prophylaxis, did not tolerate Zonisamide. She is on Primidone 50mg  qhs for essential tremor, tremor is most bothersome of her symptoms, she did not tolerate higher dose and asks about other options. We discussed Propranolol, including side effects, will confirm with her cardiologist about the Bystolic before starting Propranolol. Other option (second line for tremor) would be gabapentin. She has been sensitive to most medications, would start at lowest dose. She is aware of [place] driving laws to stop driving after a seizure until 6 months seizure-free. Follow-up in 4-5 months, call for any changes.    Thank you for allowing me to participate in her care.  Please do not hesitate to call for any questions or concerns.    , M.D.   CC: , PA-C, Dr. Patrcia Dolly

## 2021-07-30 ENCOUNTER — Other Ambulatory Visit (HOSPITAL_COMMUNITY): Payer: Self-pay

## 2021-08-02 ENCOUNTER — Encounter: Payer: Self-pay | Admitting: Neurology

## 2021-08-09 ENCOUNTER — Other Ambulatory Visit (HOSPITAL_COMMUNITY): Payer: Self-pay

## 2021-08-18 ENCOUNTER — Other Ambulatory Visit (HOSPITAL_COMMUNITY): Payer: Self-pay

## 2021-09-13 ENCOUNTER — Other Ambulatory Visit (HOSPITAL_COMMUNITY): Payer: Self-pay

## 2021-09-13 MED ORDER — FLUCONAZOLE 150 MG PO TABS
150.0000 mg | ORAL_TABLET | ORAL | 1 refills | Status: DC
  Filled 2021-09-13: qty 2, 3d supply, fill #0

## 2021-09-20 ENCOUNTER — Other Ambulatory Visit: Payer: Self-pay | Admitting: Obstetrics and Gynecology

## 2021-09-20 DIAGNOSIS — Z803 Family history of malignant neoplasm of breast: Secondary | ICD-10-CM

## 2021-10-13 ENCOUNTER — Telehealth: Payer: 59 | Admitting: Medical

## 2021-10-13 ENCOUNTER — Other Ambulatory Visit (HOSPITAL_COMMUNITY): Payer: Self-pay

## 2021-10-13 ENCOUNTER — Encounter: Payer: Self-pay | Admitting: Medical

## 2021-10-13 VITALS — Wt 172.2 lb

## 2021-10-13 DIAGNOSIS — R635 Abnormal weight gain: Secondary | ICD-10-CM | POA: Diagnosis not present

## 2021-10-13 DIAGNOSIS — R232 Flushing: Secondary | ICD-10-CM

## 2021-10-13 DIAGNOSIS — Z1329 Encounter for screening for other suspected endocrine disorder: Secondary | ICD-10-CM

## 2021-10-13 DIAGNOSIS — R5383 Other fatigue: Secondary | ICD-10-CM | POA: Insufficient documentation

## 2021-10-13 DIAGNOSIS — R609 Edema, unspecified: Secondary | ICD-10-CM | POA: Insufficient documentation

## 2021-10-13 DIAGNOSIS — J301 Allergic rhinitis due to pollen: Secondary | ICD-10-CM | POA: Diagnosis not present

## 2021-10-13 MED ORDER — AZELASTINE-FLUTICASONE 137-50 MCG/ACT NA SUSP
1.0000 | Freq: Two times a day (BID) | NASAL | 2 refills | Status: DC
  Filled 2021-10-13: qty 23, 30d supply, fill #0

## 2021-10-13 NOTE — Progress Notes (Signed)
This visit type was conducted due to national recommendations for restrictions regarding the COVID-19 Pandemic (e.g. social distancing) in an effort to limit this patient's exposure and mitigate transmission in our community.  Due to their co-morbid illnesses, this patient is at least at moderate risk for complications without adequate follow up.  This format is felt to be most appropriate for this patient at this time.   ? ?Documentation for virtual audio and video telecommunications through [place] encounter: ? ?The patient was located at home. ?The provider was located in the office. ?The patient did consent to this visit and is aware of possible charges through their insurance for this visit. ? ?The other persons participating in this telemedicine service were none. ?Time spent on call was 20 minutes and in review of previous records >30 minutes total. ? ?This virtual service is not related to other E/M service within previous 7 days. ? ?Subjective: ?Chief Complaint  ?Patient presents with  ? Allergies  ?  Allergies and weight loss  ? ?Virtual consult discussed allergies.  Lately her allergies are getting her real problem.  Using Claritin currently but having a lot of sneezing and a lot of runny nose like a faucet.  Sneezing maybe 50 times a day.  Can even wake up in the middle the night with drippy nose.  Not currently using nasal spray.  She used to live in [place] and her allergies were not as bad as when she moved to [place] 3 years ago. ? ?She has concerns about roughly 10 pound weight gain in recent months or so.  She exercises 3 to 4 days/week.  No recent diet or exercise changes.  She does feel like she has swelling, as her fingers and feet can be puffy, particular worse at the end of the day.  Fingers feel like sausages at times.  No joint pain.  No history of rheumatoid or condition. ? ?she denies any change in diet or calories.   ? ?No history of thyroid disorder.  She does feel like she  gets some hot flashes and feels like the weight gain has been worse since her Bystolic was changed to propanolol by neurology and cardiology recently.   ? ?She has a history of gastric bypass surgery. ? ?She has a stand up desk, tries to avoid prolonged sitting or standing in the same place.  ? ?She notes that she has been on primidone for years.  She has been on Trokendi since the fall 2022. ? ?Past Medical History:  ?Diagnosis Date  ? Anxiety   ? GERD (gastroesophageal reflux disease)   ? History of bleeding ulcers   ? Hypotension   ? Seizures (HCC)   ? last seizure 2010.  last EEG 2015  ? ?Current Outpatient Medications on File Prior to Visit  ?Medication Sig Dispense Refill  ? clotrimazole-betamethasone (LOTRISONE) cream APPLY TO THE AFFECTED AND SURROUNDING AREAS OF SKIN BY TOPICAL ROUTE 2 TIMES PER DAY IN THE MORNING AND EVENING AS NEEDED 45 g 3  ? omeprazole (PRILOSEC) 40 MG capsule Take 1 capsule (40 mg total) by mouth daily. 90 capsule 0  ? primidone (MYSOLINE) 50 MG tablet Take 1 tablet (50 mg total) by mouth at bedtime. 90 tablet 3  ? propranolol (INDERAL) 20 MG tablet Take 1 tablet (20 mg total) by mouth every night 90 tablet 3  ? Topiramate ER (TROKENDI XR) 100 MG CP24 Take 1 capsule by mouth nightly 90 capsule 3  ? clonazePAM (KLONOPIN) 0.5 MG  tablet Take 1 tablet (0.5 mg total) by mouth 2 (two) times daily as needed for anxiety. (Patient not taking: Reported on 10/13/2021) 20 tablet 0  ? ?No current facility-administered medications on file prior to visit.  ? ?ROS as in subjective: ? ? ? ?Objective: ?Wt 172 lb 3.2 oz (78.1 kg)   BMI 29.56 kg/m?  ? ? ?Gen: wd, wn nad ?Not examined in person as this is a virtual consult ? ? ?Assessment: ?Encounter Diagnoses  ?Name Primary?  ? Seasonal allergic rhinitis due to pollen Yes  ? Weight gain   ? Hot flashes   ? Low energy   ? Swelling   ? Screening for thyroid disorder   ? ? ? ?Plan: ?Allergic rhinitis-stop Claritin.  Begin over-the-counter Xyzal or Allegra  daily QHS for the next month.  Begin allergy nasal spray daily in monring.  Use nasal saline flush in the evenings. ? ?Weight gain, low energy, hot flashes, swelling-discussed possible causes.  She will return for some baseline labs.  We will likely do a trial of hydrochlorothiazide low-dose pending labs.  We discussed using compressive socks, getting regular exercise ? ?Katheryne was seen today for allergies. ? ?Diagnoses and all orders for this visit: ? ?Seasonal allergic rhinitis due to pollen ? ?Weight gain ?-     TSH; Future ?-     Basic metabolic panel; Future ? ?Hot flashes ?-     TSH; Future ? ?Low energy ?-     TSH; Future ? ?Swelling ?-     Basic metabolic panel; Future ? ?Screening for thyroid disorder ?-     TSH; Future ? ?Other orders ?-     Azelastine-Fluticasone (DYMISTA) 137-50 MCG/ACT SUSP; Place 1 spray into the nose 2 (two) times daily. ? ? ?F/u pending labs ? ? ? ? ? ? ? ? ? ?

## 2021-10-19 ENCOUNTER — Other Ambulatory Visit: Payer: Self-pay | Admitting: Medical

## 2021-10-19 ENCOUNTER — Other Ambulatory Visit (HOSPITAL_COMMUNITY): Payer: Self-pay

## 2021-10-19 MED ORDER — OMEPRAZOLE 40 MG PO CPDR
40.0000 mg | DELAYED_RELEASE_CAPSULE | Freq: Every day | ORAL | 0 refills | Status: DC
  Filled 2021-10-19: qty 90, 90d supply, fill #0

## 2021-11-11 ENCOUNTER — Telehealth: Payer: Self-pay

## 2021-11-11 NOTE — Telephone Encounter (Signed)
Pt. Called wanting to know if you could add and A1C and a B-12  to her lab order she is coming in tomorrow morning.

## 2021-11-12 ENCOUNTER — Other Ambulatory Visit: Payer: 59

## 2021-11-12 ENCOUNTER — Other Ambulatory Visit: Payer: Self-pay | Admitting: Medical

## 2021-11-12 DIAGNOSIS — Z1329 Encounter for screening for other suspected endocrine disorder: Secondary | ICD-10-CM | POA: Diagnosis not present

## 2021-11-12 DIAGNOSIS — R609 Edema, unspecified: Secondary | ICD-10-CM

## 2021-11-12 DIAGNOSIS — E538 Deficiency of other specified B group vitamins: Secondary | ICD-10-CM | POA: Diagnosis not present

## 2021-11-12 DIAGNOSIS — R232 Flushing: Secondary | ICD-10-CM

## 2021-11-12 DIAGNOSIS — Z131 Encounter for screening for diabetes mellitus: Secondary | ICD-10-CM

## 2021-11-12 DIAGNOSIS — R5383 Other fatigue: Secondary | ICD-10-CM | POA: Diagnosis not present

## 2021-11-12 DIAGNOSIS — R635 Abnormal weight gain: Secondary | ICD-10-CM

## 2021-11-13 LAB — HEMOGLOBIN A1C
Est. average glucose Bld gHb Est-mCnc: 111 mg/dL
Hgb A1c MFr Bld: 5.5 % (ref 4.8–5.6)

## 2021-11-13 LAB — BASIC METABOLIC PANEL
BUN/Creatinine Ratio: 10 (ref 9–23)
BUN: 9 mg/dL (ref 6–24)
CO2: 23 mmol/L (ref 20–29)
Calcium: 10.4 mg/dL — ABNORMAL HIGH (ref 8.7–10.2)
Chloride: 104 mmol/L (ref 96–106)
Creatinine, Ser: 0.93 mg/dL (ref 0.57–1.00)
Glucose: 86 mg/dL (ref 70–99)
Potassium: 4.4 mmol/L (ref 3.5–5.2)
Sodium: 142 mmol/L (ref 134–144)
eGFR: 75 mL/min/{1.73_m2} (ref >59)

## 2021-11-13 LAB — TSH: TSH: 1.53 u[IU]/mL (ref 0.450–4.500)

## 2021-11-13 LAB — VITAMIN B12: Vitamin B-12: >2000 pg/mL — ABNORMAL HIGH (ref 232–1245)

## 2021-11-15 ENCOUNTER — Other Ambulatory Visit (HOSPITAL_COMMUNITY): Payer: Self-pay

## 2021-11-16 ENCOUNTER — Other Ambulatory Visit (HOSPITAL_COMMUNITY): Payer: Self-pay

## 2021-11-16 ENCOUNTER — Other Ambulatory Visit: Payer: Self-pay | Admitting: Medical

## 2021-11-16 ENCOUNTER — Ambulatory Visit: Payer: 59 | Admitting: Cardiology

## 2021-11-16 ENCOUNTER — Encounter: Payer: Self-pay | Admitting: Cardiology

## 2021-11-16 VITALS — BP 113/76 | HR 76 | Temp 98.0°F | Resp 17 | Ht 64.0 in | Wt 178.0 lb

## 2021-11-16 DIAGNOSIS — I493 Ventricular premature depolarization: Secondary | ICD-10-CM | POA: Diagnosis not present

## 2021-11-16 MED ORDER — HYDROCHLOROTHIAZIDE 12.5 MG PO TABS
12.5000 mg | ORAL_TABLET | Freq: Every day | ORAL | 1 refills | Status: DC
  Filled 2021-11-16: qty 30, 30d supply, fill #0

## 2021-11-16 NOTE — Progress Notes (Addendum)
Patient referred by Carlena Hurl, PA-C for palpitations  Subjective:   Latasha Ramirez, female    DOB: 1970-07-23, 51 y.o.   MRN: [id number]   Chief Complaint  Patient presents with   Edema   Follow-up    HPI  51 y.o. Caucasian female with symptomatic PVC's  Patient came off bystolic in favor of propranolol for management of tremors. Since then, she has felt "bloated, legs have felt puffy". She would like to come off propranolol.  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 She moved from [place] in 2020. She was last seen by a cardiologist in [facility], Dr. Forestine Chute and diagnosed with symptomatic PVC's. She was initially put on Toprol, later switched to Bystolic due to hypotension. She did really well on bystolic until it was switched to generic nebivolol. Since then, patient's symptoms of palpitations have worsened, She is requiring to take 2 pills instead of 1, which has correlated with further hypotension. She denies chest pain, shortness of breath, leg edema, orthopnea, PND, TIA/syncope.    Current Outpatient Medications:    Azelastine-Fluticasone (DYMISTA) 137-50 MCG/ACT SUSP, Place 1 spray into the nose 2 (two) times daily., Disp: 23 g, Rfl: 2   clonazePAM (KLONOPIN) 0.5 MG tablet, Take 1 tablet (0.5 mg total) by mouth 2 (two) times daily as needed for anxiety. (Patient not taking: Reported on 10/13/2021), Disp: 20 tablet, Rfl: 0   clotrimazole-betamethasone (LOTRISONE) cream, APPLY TO THE AFFECTED AND SURROUNDING AREAS OF SKIN BY TOPICAL ROUTE 2 TIMES PER DAY IN THE MORNING AND EVENING AS NEEDED, Disp: 45 g, Rfl: 3   hydrochlorothiazide (HYDRODIURIL) 12.5 MG tablet, Take 1 tablet (12.5 mg total) by mouth daily., Disp: 30 tablet, Rfl: 1   omeprazole (PRILOSEC) 40 MG capsule, Take 1 capsule (40 mg total) by mouth daily., Disp: 90 capsule, Rfl: 0   primidone (MYSOLINE) 50 MG tablet, Take 1 tablet (50 mg total) by mouth at  bedtime., Disp: 90 tablet, Rfl: 3   propranolol (INDERAL) 20 MG tablet, Take 1 tablet (20 mg total) by mouth every night, Disp: 90 tablet, Rfl: 3   Topiramate ER (TROKENDI XR) 100 MG CP24, Take 1 capsule by mouth nightly, Disp: 90 capsule, Rfl: 3    Cardiovascular and other pertinent studies:  EKG 11/16/2021: Sinus rhythm 79 bpm Normal EKG  Recent labs: 07/14/2020: Glucose 97, BUN/Cr 9/0.92. EGFR >60. Na/K 136/4.0. Rest of the CMP normal H/H 13/41. MCV 95. Platelets 171 HbA1C N/A Trop HS 3  10/2019: Chol 151, TG 50, HDL 76, LDL 64 TSH N/A   Review of Systems  Constitutional: Positive for weight gain.  Cardiovascular:  Positive for leg swelling and palpitations. Negative for chest pain, dyspnea on exertion and syncope.  Neurological:  Positive for light-headedness.        Vitals:   11/16/21 1412  BP: 113/76  Pulse: 76  Resp: 17  Temp: 98 F (36.7 C)  SpO2: 100%     Body mass index is 30.55 kg/m. Filed Weights   11/16/21 1412  Weight: 178 lb (80.7 kg)     Objective:   Physical Exam Vitals and nursing note reviewed.  Constitutional:      General: She is not in acute distress. Neck:     Vascular: No JVD.  Cardiovascular:     Rate and Rhythm: Normal rate and regular rhythm.     Pulses: Normal pulses.     Heart sounds: Normal heart sounds. No murmur heard.  Pulmonary:     Effort: Pulmonary effort is normal.     Breath sounds: Normal breath sounds. No wheezing or rales.  Musculoskeletal:     Right lower leg: Edema (Trace) present.     Left lower leg: Edema (Trace) present.         Assessment & Recommendations:   51 y.o. Caucasian female with symptomatic PVC's  Symptomatic PVC's: Revert back to Bystolic, instead of propranolol (due to side effects) or generic nebivolol (which she has not tolerated). I will need to check with Dr. Delice Lesch if that would be okay.  F/u in 6 months   Nigel Mormon, MD Pager: [phone] Office:  [phone]  Addendum: Discussed w/Dr. Delice Lesch. Okay to switch to [employer]. Will send prescription.   Nigel Mormon, MD Pager: [phone] Office: [phone]

## 2021-11-23 ENCOUNTER — Other Ambulatory Visit (HOSPITAL_COMMUNITY): Payer: Self-pay

## 2021-11-23 MED ORDER — NEBIVOLOL HCL 5 MG PO TABS
5.0000 mg | ORAL_TABLET | Freq: Every day | ORAL | 3 refills | Status: DC
  Filled 2021-11-23 – 2021-12-02 (×2): qty 90, 90d supply, fill #0

## 2021-11-23 NOTE — Addendum Note (Signed)
Addended by: Elder Negus on: 11/23/2021 09:20 AM   Modules accepted: Orders

## 2021-11-24 ENCOUNTER — Other Ambulatory Visit (HOSPITAL_COMMUNITY): Payer: Self-pay

## 2021-11-24 ENCOUNTER — Ambulatory Visit: Payer: 59 | Admitting: Neurology

## 2021-11-24 ENCOUNTER — Encounter: Payer: Self-pay | Admitting: Neurology

## 2021-11-24 VITALS — BP 101/71 | Ht 64.0 in | Wt 179.0 lb

## 2021-11-24 DIAGNOSIS — G43009 Migraine without aura, not intractable, without status migrainosus: Secondary | ICD-10-CM

## 2021-11-24 DIAGNOSIS — G250 Essential tremor: Secondary | ICD-10-CM

## 2021-11-24 DIAGNOSIS — Z87898 Personal history of other specified conditions: Secondary | ICD-10-CM | POA: Diagnosis not present

## 2021-11-24 MED ORDER — TOPIRAMATE ER 100 MG PO CAP24
1.0000 | ORAL_CAPSULE | Freq: Every day | ORAL | 3 refills | Status: DC
  Filled 2021-11-24 – 2022-02-14 (×2): qty 90, 90d supply, fill #0
  Filled 2022-05-03: qty 90, 90d supply, fill #1

## 2021-11-24 MED ORDER — PRIMIDONE 50 MG PO TABS
50.0000 mg | ORAL_TABLET | Freq: Every day | ORAL | 3 refills | Status: DC
  Filled 2021-11-24 – 2022-04-28 (×2): qty 90, 90d supply, fill #0

## 2021-11-24 NOTE — Patient Instructions (Signed)
Try reducing Primidone 50mg : Take 1/2 tablet daily. See how you feel on lower dose.  2. Continue Trokendi XR 100mg  daily.  3. The monthly injections for migraine prevention are Aimovig, Ajovy, and Emgality. Let me know if you are interested in one of them  4. Follow-up in 6 months, call for any changes

## 2021-11-24 NOTE — Progress Notes (Signed)
NEUROLOGY FOLLOW UP OFFICE NOTE  Latasha Ramirez [id number] Jun 07, 1971  HISTORY OF PRESENT ILLNESS: I had the pleasure of seeing Latasha Ramirez in follow-up in the neurology clinic on 11/24/2021.  The patient was last seen 3 months ago for seizures, essential tremor, and cervicogenic headaches. She is alone in the office today. Records and images were personally reviewed where available. She did not tolerate Propranolol and asked to switch back to [facility] for PVCs. She however reports that she gained close to 20 lbs since switching and will be going back to [place] today. She did not notice any change in her tremors with Propranolol. She is on Trokendi XR 100mg  daily for seizure and headache prophylaxis, as well as essential tremor. She is on Primidone 50mg  qhs for tremor as well, did not tolerate higher doses. She does feel Trokendi helps with headaches, when she misses a dose, she wakes up with a splitting headache. She feels very shaky when she does not take the Primidone, and notes that tremors may be more when she is anxious. She continues to have difficulties with word finding, having a hard time when presenting at meetings. She also wonders about brain fog due to omeprazole, however GERD symptoms are significant without medication. She denies any loss of consciousness, no seizures since 2011, no vasovagal syncope since 2020. She usually gets 6-7 hours of sleep and feels rested.    History on Initial Assessment 09/23/2020: This is a 51 year old right-handed woman with a history of hypotension, gastric bypass, essential tremor, and seizures, presenting to establish care.  1. Seizures. The first seizure occurred at age 76, it was unwitnessed with no prior warning symptoms, she woke up on the bathroom floor with drool on her arm. She denies any tongue bite or incontinence (prior neurology notes indicate there was tongue bite and incontinence with her first GTC in July 2011). She was started on  Levetiracetam then switched to Topiramate in 2018 due to side effects of feeling "zombiefied." Prior to the GTC, she was having recurrent syncopal episodes where she would lose consciousness for 15 seconds, attributed to hypotension. They would usually occur when taking a shower or getting up in the morning. She has had 4 episodes, last occurred 1.5 years ago while taking a hot shower, her hearing went and she had tunnel vision, she called for her husband and he lowered her to the floor. As soon as she went out, she woke up, no post-event confusion. She denies any staring/unresponsive episodes, olfactory/gustatory hallucinations, myoclonic jerks. She woke up last Saturday with numbness in her left 2 fingers which took several hours to feel normal. A maternal aunt and paternal cousin have seizures. Otherwise she had a normal birth and early development, no history of febrile convulsions, CNS infections, neurosurgical procedures, significant head injuries.   2. She has benign essential tremor, with family history of tremors in at least 2 maternal cousins. She has had tremors her whole life, more noticeable on the right hand. She notices it when carrying something, it occasionally affects handwriting. It does not affect eating/drinking or affect work. She was started on Primidone in 2016, taking 100mg  daily with good response. She feels it has affected her libido for the past 3-4 years.  3. Headaches. She has had headaches for several years with pain in the back of her head. No associated nausea/vomiting, photo/phonophobia. Headaches occur at least once a week, she takes Ibuprofen or Tylenol. She notices more pain when she wakes up in the  morning, back of head "is just awful," with 5-6 over 10 pain, going down to a 1-2 with increase in Topiramate to 50mg  in AM, 100mg  in PM. She was having neck pain, she saw a chiropractor which has helped some with headaches, neck pain somewhat better. She has stretching exercises.    4. Memory loss. 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 She has word-finding difficulties. She denies getting lost driving or missing medications. Bills are on autopay. There is a history of dementia on her father's side.    Prior AEDs: Levetiracetam Laboratory Data:  EEGs: Done at [facility] in [place] - normal wake and sleep EEG in 2019, 2017. Per notes, "a previous EEG (not available for review) was abnormal"  MRI: MRI without contrast done 2011 in [place], [place] reported as normal.  MRI brain 06/2020: No acute changes. I personally reviewed images, there is minimal scattered T2/FLAIR hyperintensities within the frontal predominant white matter. Hippocampi are symmetric and within normal limits.  PAST MEDICAL HISTORY: Past Medical History:  Diagnosis Date   Anxiety    GERD (gastroesophageal reflux disease)    History of bleeding ulcers    Hypotension    Seizures ([place])    last seizure 2010.  last EEG 2015    MEDICATIONS: Current Outpatient Medications on File Prior to Visit  Medication Sig Dispense Refill   Multiple Vitamin (MULTIVITAMIN) tablet Take 1 tablet by mouth daily.     nebivolol (BYSTOLIC) 5 MG tablet Take 1 tablet (5 mg total) by mouth daily. 90 tablet 3   omeprazole (PRILOSEC) 40 MG capsule Take 1 capsule (40 mg total) by mouth daily. 90 capsule 0   primidone (MYSOLINE) 50 MG tablet Take 1 tablet (50 mg total) by mouth at bedtime. 90 tablet 3   Topiramate ER (TROKENDI XR) 100 MG CP24 Take 1 capsule by mouth nightly 90 capsule 3   Azelastine-Fluticasone (DYMISTA) 137-50 MCG/ACT SUSP Place 1 spray into the nose 2 (two) times daily. (Patient not taking: Reported on 11/24/2021) 23 g 2   hydrochlorothiazide (HYDRODIURIL) 12.5 MG tablet Take 1 tablet (12.5 mg total) by mouth daily. (Patient not taking: Reported on 11/24/2021) 30 tablet 1   No current facility-administered  medications on file prior to visit.    ALLERGIES: Allergies  Allergen Reactions   Other Rash    Foam tape cause blisters / all adhesives cause contact rash    Adhesive [Tape]    Flexeril [Cyclobenzaprine]     Palpitations on Flexeril and Skelaxin    Levaquin [Levofloxacin]     Diarrhea, GI upset   Zoloft [Sertraline Hcl]     Heart palpitations, PVCs.  Including adverse reaction to zoloft, paxil, lexapro   Zonisamide     Moderate diarrhea   Sulfa Antibiotics Rash    FAMILY HISTORY: Family History  Problem Relation Age of Onset   Ovarian cancer Mother    Cancer Mother 37       ovarian   Heart disease Father 38       died with MI   Diabetes Father    Diabetes Maternal Grandmother    Suicidality Maternal Grandfather    Diabetes Paternal Grandmother    Skin cancer Paternal Grandfather    Cancer Paternal Aunt        breast   Cancer Paternal Uncle        bladder   Cancer Paternal Aunt  breast    SOCIAL HISTORY: Social History   Socioeconomic History   Marital status: Divorced    Spouse name: Not on file   Number of children: 1   Years of education: Not on file   Highest education level: Not on file  Occupational History   Not on file  Tobacco Use   Smoking status: Never   Smokeless tobacco: Never  Vaping Use   Vaping Use: Never used  Substance and Sexual Activity   Alcohol use: Yes    Comment: socially, rarely   Drug use: Never   Sexual activity: Not on file  Other Topics Concern   Not on file  Social History Narrative   Lives with significant other and has son in college   Exercise - walking, yoga, piliates, some weights   Right handed    Works as work site Buyer, retail   10/30/20   Two story home   Social Determinants of Health   Financial Resource Strain: Not on file  Food Insecurity: Not on file  Transportation Needs: Not on file  Physical Activity: Not on file  Stress: Not on file  Social Connections: Not on file  Intimate  Partner Violence: Not on file     PHYSICAL EXAM: Vitals:   11/24/21 1125  BP: 101/71   General: No acute distress Head:  Normocephalic/atraumatic Skin/Extremities: No rash, no edema Neurological Exam: alert and awake. No aphasia or dysarthria. Fund of knowledge is appropriate. Attention and concentration are normal.   Cranial nerves: Pupils equal, round. Extraocular movements intact with no nystagmus. Visual fields full.  No facial asymmetry.  Motor: Bulk and tone normal, muscle strength 5/5 throughout with no pronator drift.   Finger to nose testing intact.  Gait narrow-based and steady, able to tandem walk adequately.  Romberg negative. No tremors in office today.   Request for reasonable accomodation that she does not have to prsent for mtgs, with word searching   IMPRESSION: This is a pleasant 51 yo RH hypotension, gastric bypass, essential tremor, seizures, and headaches. She has been seizure-free since 2011, no episodes of vasovagal syncope since 2020. Trokendi XR 100mg  daily does help with headaches, however she notes cognitive difficulties and requests for reasonable accomodation that she would not have to present at meetings due to word-finding difficulties. She also feels Primidone 50mg  qhs helps but continues to have tremors, with side effects on higher doses. She can try reducing to 1/2 tablet daily. She has been sensitive to different medication trials, we discussed gabapentin can also help with headaches and tremors, however may cause weight gain, which is a major concern. There are not much options for tremor at this point, no tremor in office today. There may be some worsening with anxiety, could consider treating anxiety with PCP. We may consider CGRP inhibitors for headache prophylaxis. Follow-up in 6 months, call for any changes.    Thank you for allowing me to participate in her care.  Please do not hesitate to call for any questions or concerns.    Ellouise Newer,  M.D.   CC: Chana Bode, PA-C

## 2021-11-27 ENCOUNTER — Ambulatory Visit
Admission: RE | Admit: 2021-11-27 | Discharge: 2021-11-27 | Disposition: A | Discharge status: Home / Self Care | Payer: 59 | Source: Ambulatory Visit | Attending: Obstetrics and Gynecology | Admitting: Obstetrics and Gynecology

## 2021-11-27 DIAGNOSIS — Z803 Family history of malignant neoplasm of breast: Secondary | ICD-10-CM

## 2021-11-27 DIAGNOSIS — N6489 Other specified disorders of breast: Secondary | ICD-10-CM | POA: Diagnosis not present

## 2021-11-27 IMAGING — MR MR BREAST BILAT WO/W CM
8 of 12 series · 31 of 48 positions shown · IV contrast (8ml gadavist)
Comparison: Previous exams.

CLINICAL DATA: Follow-up for bilateral benign breast biopsies in
[DATE].

EXAM:
BILATERAL BREAST MRI WITH AND WITHOUT CONTRAST
TECHNIQUE: Multiplanar, multisequence MR images of both breasts were obtained
prior to and following the intravenous administration of 8 ml of
Gadavist

[Series 2: t2_tirm_tra ipat (a-p) · axial · 3.0mm · 0.74mm/px · 1 of 63 slices shown]
[im 1/63]
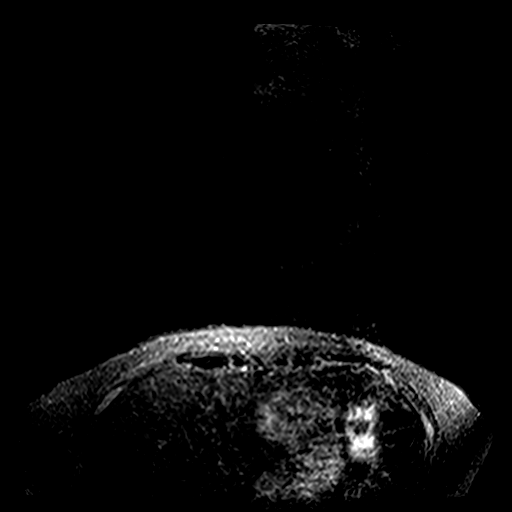

[Series 3: fl3d pre-cm no · axial · non-contrast · 1.2mm · 0.99mm/px · z∈[-89,+83]mm · 5 of 144 slices shown]
[im 1/144]
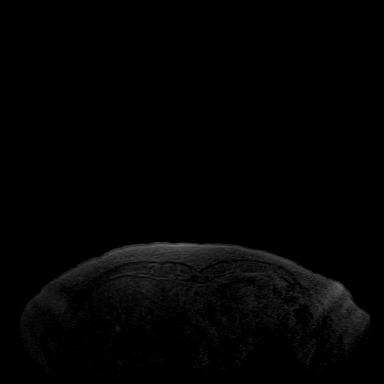
[im 36/144]
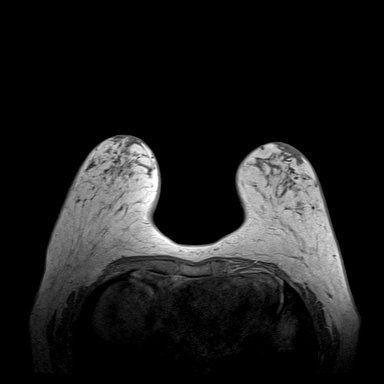
[im 72/144]
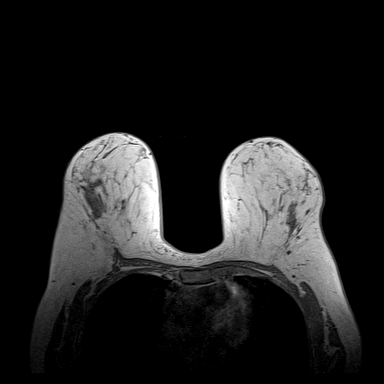
[im 108/144]
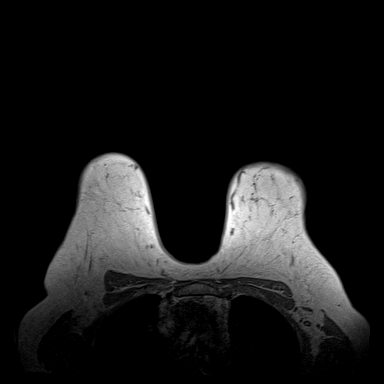
[im 144/144]
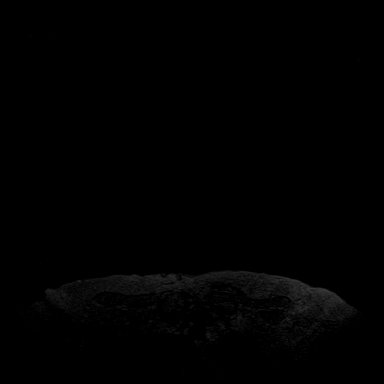

[Series 4: fl3d pre-cm · axial · non-contrast · 1.2mm · 0.99mm/px · z∈[-89,+83]mm · 5 of 144 slices shown]
[im 1/144]
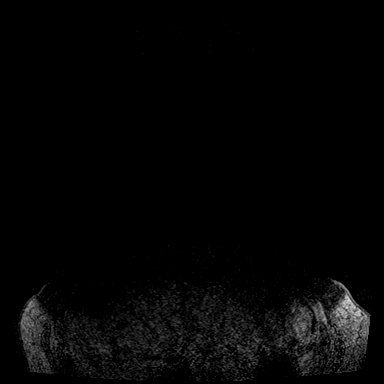
[im 36/144]
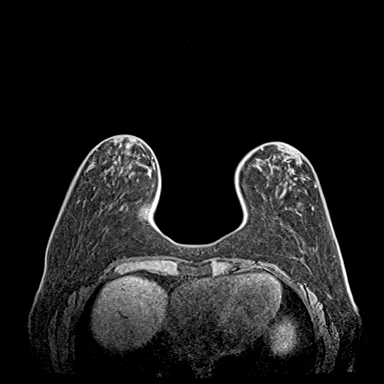
[im 72/144]
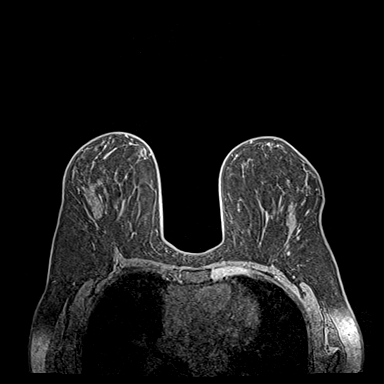
[im 108/144]
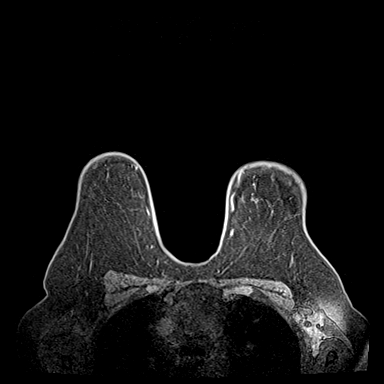
[im 144/144]
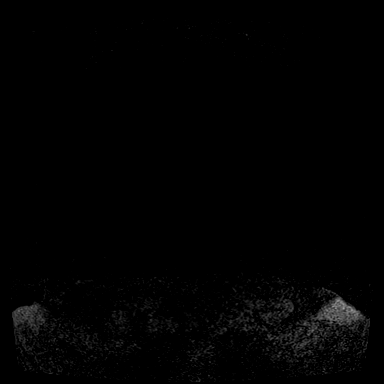

[Series 5: fl3d post-cm 20 · axial · 1.2mm · 0.99mm/px · z∈[-89,+83]mm · 5 of 144 slices shown (1 of 3)]
[im 1/144]
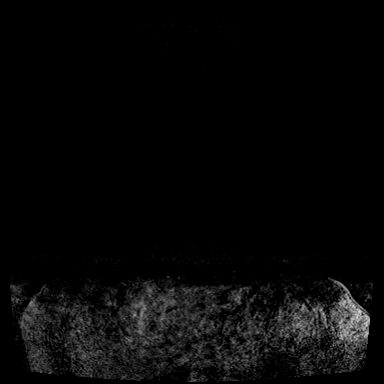
[im 36/144]
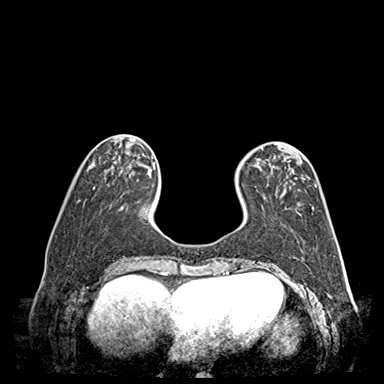
[im 72/144]
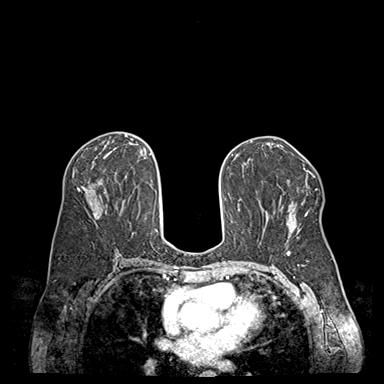
[im 108/144]
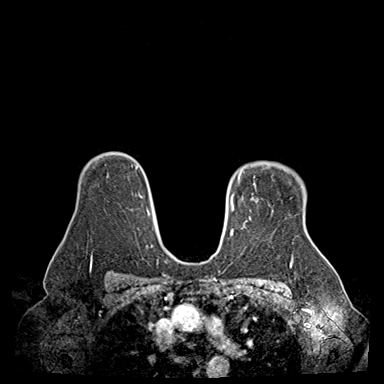
[im 144/144]
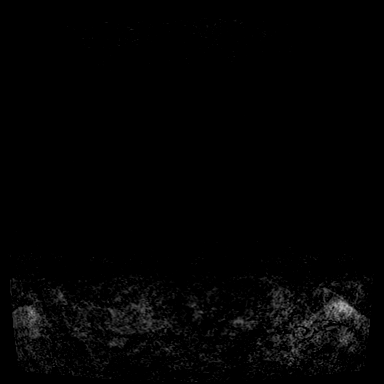

[Series 6: fl3d post-cm 20 · axial · 1.2mm · 0.99mm/px · z∈[-89,+83]mm · 5 of 144 slices shown (2 of 3)]
[im 1/144]
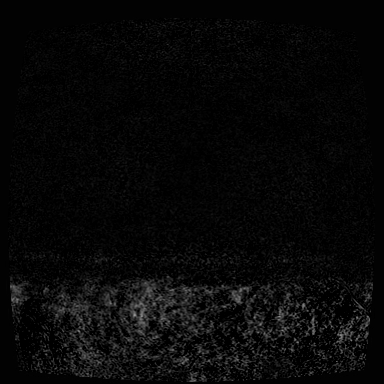
[im 36/144]
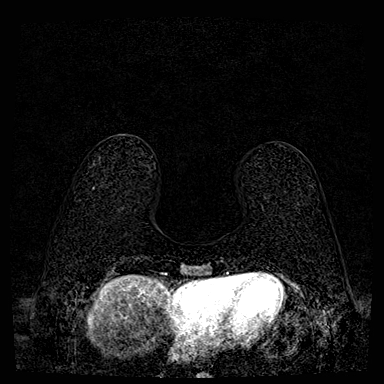
[im 72/144]
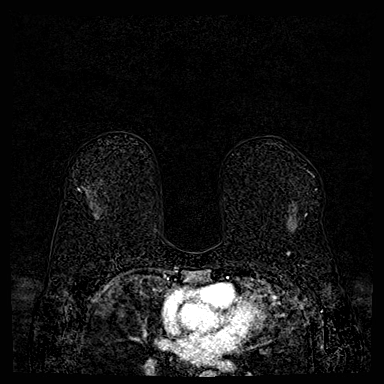
[im 108/144]
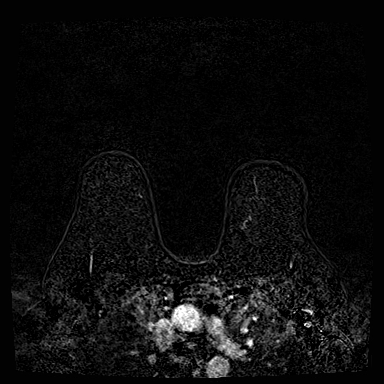
[im 144/144]
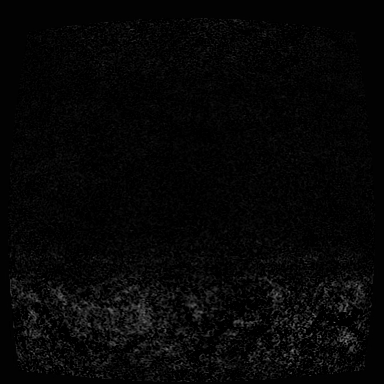

[Series 7: fl3d post-cm 20 · axial · 172.8mm · 0.99mm/px · 1 of 1 slices shown (3 of 3)]
[im 1/1]
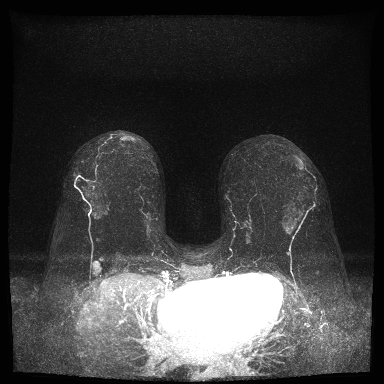

[Series 8: fl3d post-cm 3 · axial · 1.2mm · 0.99mm/px · z∈[-89,+83]mm · 6 of 144 slices shown (1 of 2)]
[im 1/144]
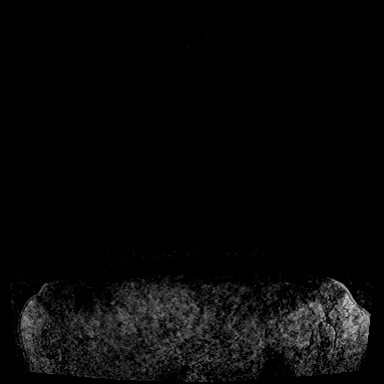
[im 29/144]
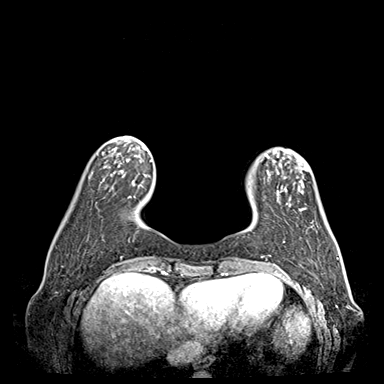
[im 58/144]
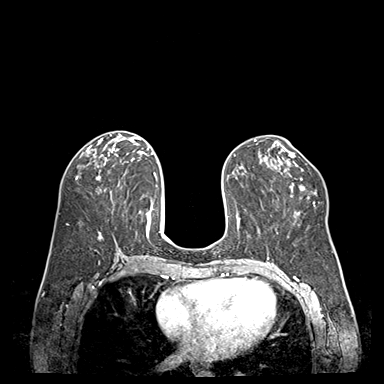
[im 86/144]
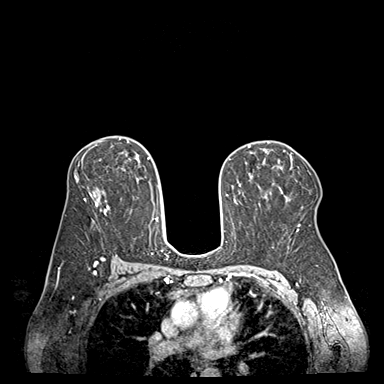
[im 115/144]
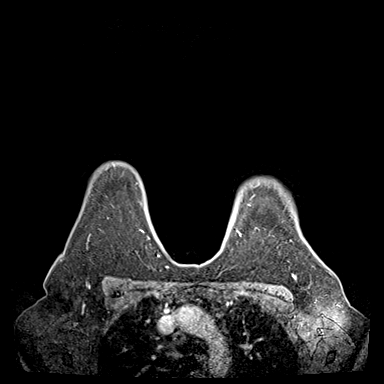
[im 144/144]
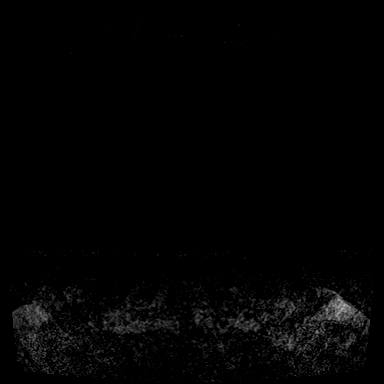

[Series 9: fl3d post-cm 3 · axial · 1.2mm · 0.99mm/px · z∈[-89,-20]mm · 3 of 144 slices shown (2 of 2)]
[im 1/144]
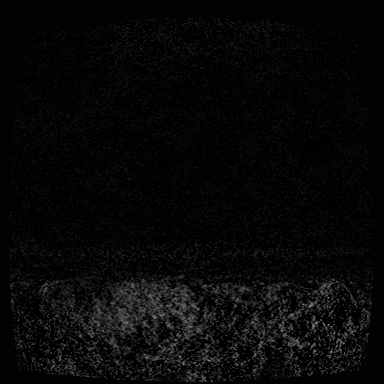
[im 29/144]
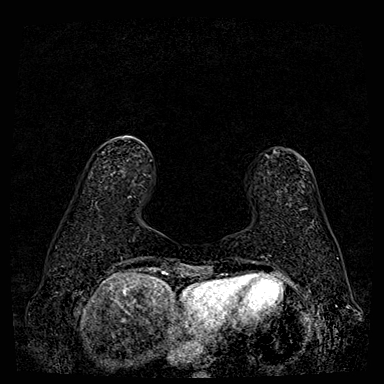
[im 58/144]
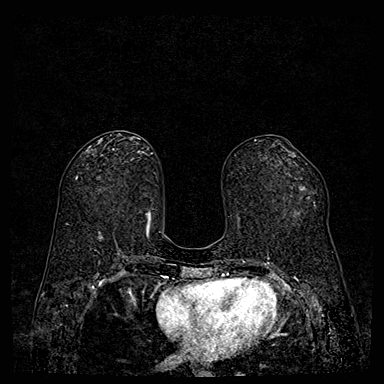

[31 of 48 positions shown; findings below may reference images not displayed]

Three-dimensional MR images were rendered by post-processing of the
original MR data on an independent workstation. The
three-dimensional MR images were interpreted, and findings are
reported in the following complete MRI report for this study. Three
dimensional images were evaluated at the independent interpreting
workstation using the DynaCAD thin client.
FINDINGS: Breast composition: b. Scattered fibroglandular tissue.

Background parenchymal enhancement: Mild

Right breast: There is susceptibility artifact in the outer right
breast at the site of prior benign biopsy. The associated non mass
enhancement has increased in size compared to prior mass spanning
approximately 4.8 cm (series 12, image 70), otherwise similar in
appearance. There are persistent kinetics. No additional new
findings elsewhere in the right breast.

Left breast: There is susceptibility artifact in the outer left
breast at the site of prior benign biopsy. Similar to the right side
the associated non mass enhancement in this area has increased in
size spanning 4.9 cm (series 12, image 79). There is a new small
amount of linear non mass enhancement in the upper inner left breast
spanning 2.5 cm (series 12, image 42), demonstrating persistent
kinetics.

Lymph nodes: No abnormal appearing lymph nodes.

Ancillary findings: Small amount of nonenhancing T2 bright signal
along the right-sided heart in the anterior chest (series 2, image
32), possibly a small amount of atelectasis or pericardial cyst.
IMPRESSION: 1. Interval increase in size of the associated non mass enhancement
in the outer right breast spanning approximately 4.8 cm. Similar
interval increase in size of the non mass enhancement in the outer
left breast spanning 4.9 cm. This is probably benign and still
favored to represent PASH/fibrocystic changes given similar changes
bilaterally.
2. New probably benign small amount of linear non mass enhancement
in the upper inner left breast spanning 2.5 cm, similar in
appearance to the previously biopsied benign non mass enhancement.
3. Small amount of nonenhancing T2 bright signal along the
right-sided heart in the anterior chest, possibly a small amount of
atelectasis or pericardial cyst.

RECOMMENDATION:
Bilateral breast MRI in 6 months.

BI-RADS CATEGORY  3: Probably benign.

## 2021-11-27 MED ORDER — GADOBUTROL 1 MMOL/ML IV SOLN
8.0000 mL | Freq: Once | INTRAVENOUS | Status: AC | PRN
  Administered 2021-11-27: 8 mL via INTRAVENOUS

## 2021-12-01 ENCOUNTER — Ambulatory Visit: Payer: 59 | Admitting: Neurology

## 2021-12-01 ENCOUNTER — Other Ambulatory Visit (HOSPITAL_COMMUNITY): Payer: Self-pay

## 2021-12-02 ENCOUNTER — Other Ambulatory Visit (HOSPITAL_COMMUNITY): Payer: Self-pay

## 2021-12-02 ENCOUNTER — Other Ambulatory Visit: Payer: Self-pay | Admitting: Cardiology

## 2021-12-02 ENCOUNTER — Other Ambulatory Visit: Payer: Self-pay

## 2021-12-02 MED ORDER — NEBIVOLOL HCL 5 MG PO TABS
5.0000 mg | ORAL_TABLET | Freq: Every day | ORAL | 3 refills | Status: DC
  Filled 2021-12-02: qty 90, 90d supply, fill #0
  Filled 2021-12-02: qty 30, 30d supply, fill #0
  Filled 2022-01-10: qty 30, 30d supply, fill #1

## 2021-12-06 ENCOUNTER — Ambulatory Visit: Payer: 59 | Admitting: Medical

## 2021-12-16 ENCOUNTER — Ambulatory Visit: Payer: 59 | Admitting: Medical

## 2021-12-16 ENCOUNTER — Encounter: Payer: Self-pay | Admitting: Medical

## 2021-12-16 ENCOUNTER — Other Ambulatory Visit (HOSPITAL_COMMUNITY): Payer: Self-pay

## 2021-12-16 VITALS — BP 110/70 | HR 76 | Wt 177.2 lb

## 2021-12-16 DIAGNOSIS — R251 Tremor, unspecified: Secondary | ICD-10-CM | POA: Diagnosis not present

## 2021-12-16 DIAGNOSIS — Z87898 Personal history of other specified conditions: Secondary | ICD-10-CM | POA: Diagnosis not present

## 2021-12-16 DIAGNOSIS — Z9884 Bariatric surgery status: Secondary | ICD-10-CM | POA: Diagnosis not present

## 2021-12-16 DIAGNOSIS — Z6830 Body mass index (BMI) 30.0-30.9, adult: Secondary | ICD-10-CM | POA: Insufficient documentation

## 2021-12-16 DIAGNOSIS — Z1329 Encounter for screening for other suspected endocrine disorder: Secondary | ICD-10-CM

## 2021-12-16 DIAGNOSIS — R635 Abnormal weight gain: Secondary | ICD-10-CM

## 2021-12-16 MED ORDER — SAXENDA 18 MG/3ML ~~LOC~~ SOPN
3.0000 mg | PEN_INJECTOR | Freq: Every day | SUBCUTANEOUS | 1 refills | Status: DC
  Filled 2021-12-16: qty 3, 6d supply, fill #0
  Filled 2021-12-27: qty 6, 23d supply, fill #0

## 2021-12-16 NOTE — Progress Notes (Signed)
Subjective:  Latasha Ramirez is a 51 y.o. female who presents for Chief Complaint  Patient presents with   Follow-up    Follow up from last visit. Weight concerns     Here for follow up visit with me in April 2023.    At her last visit she was complaining of some swelling.  We discussed about possibly using some hydrochlorothiazide.  However she ended up seeing cardiology shortly after my visit with her last time.  She was having some palpitations.  She had prior been on Bystolic but neurology had her on propanolol for tremor.  She was discontinued on propanolol and changed back to Bystolic.  Since being back on Bystolic the tremors are better, the swelling resolved and the palpitations resolved.  She never started the hydrochlorothiazide.  Last visit Dymista nasal spray was not covered by insurance.  However she is using saline nasal spray and this is working just fine.  She has a history of seizures but no seizures in many years.  She takes Trokendi for headaches.  This is prescribed through neurology.  Last visit in April she had a B12 level that was elevated but she does take extra B12 and her multivitamin and another supplement.  She wants help losing weight.  She has a history of gastric bypass but has gained some weight in the last year.  She is exercising walking at least 4 days/week.  She does some weightbearing exercise.  Eating 30 g + protein daily, protein shake for breakfast, salad at dinner.    Last visit her calcium was elevated (0.2 elevated) but she takes Tums somewhat regularly for heartburn.  She gets extra calcium in the diet.  She saw her neurologist recently and primidone was cut in half.  Still doing great in regards to tremor  She notes that for the record, she was looking back through some of her prior records.  She used to see a Dr. Thamas Jaegers but she feels like some of the data in her chart record is not correct.  She said way back years ago when she was seeing that  doctor they had listed that she was incontinent and had bit her time because of bleeding from a seizure and she says that was never the case.  She wanted to at least state this for the record.  No other aggravating or relieving factors.    No other c/o.  Past Medical History:  Diagnosis Date   Anxiety    GERD (gastroesophageal reflux disease)    History of bleeding ulcers    Hypotension    Seizures ([facility])    last seizure 2010.  last EEG 2015   Past Surgical History:  Procedure Laterality Date   CESAREAN SECTION     CHOLECYSTECTOMY     COLONOSCOPY  10/21/2019   Dr. Genia Harold, normal colon   ESOPHAGOGASTRODUODENOSCOPY     GASTRIC BYPASS      The following portions of the patient's history were reviewed and updated as appropriate: allergies, current medications, past family history, past medical history, past social history, past surgical history and problem list.  ROS Otherwise as in subjective above  Objective: BP 110/70   Pulse 76   Wt 177 lb 3.2 oz (80.4 kg)   SpO2 100%   BMI 30.42 kg/m   Wt Readings from Last 3 Encounters:  12/16/21 177 lb 3.2 oz (80.4 kg)  11/24/21 179 lb (81.2 kg)  11/16/21 178 lb (80.7 kg)    General appearance:  alert, no distress, well developed, well nourished Neck: supple, no lymphadenopathy, no thyromegaly, no masses Ext: no edema   Assessment: Encounter Diagnoses  Name Primary?   Weight gain Yes   BMI 30.0-30.9,adult    Screening for thyroid disorder    History of gastric bypass    History of edema    Tremor      Plan: Weight gain, BMI 30 with other comorbidities-she wants to begin a trial of Saxenda after discussing options for therapy risk and benefits of medication discussed.  We also discussed weight management clinic versus other referrals.  She also started with this approach.  Referral to nutritionist today.  Counseled on diet, exercise and strategies to lose weight.  She would like additional thyroid labs today given  the weight gain.  History of edema-resolved after cardiology recently changed her from propanolol to bisoprolol.  She never started the hydrochlorothiazide from last visit.  Tremor-improved on Bystolic compared to propanolol  Jorga was seen today for follow-up.  Diagnoses and all orders for this visit:  Weight gain -     TSH + free T4 -     T3 -     Cortisol -     Amb ref to Medical Nutrition Therapy-MNT  BMI 30.0-30.9,adult -     TSH + free T4 -     T3 -     Cortisol -     Amb ref to Medical Nutrition Therapy-MNT  Screening for thyroid disorder -     TSH + free T4 -     T3 -     Cortisol -     Amb ref to Medical Nutrition Therapy-MNT  History of gastric bypass -     TSH + free T4 -     T3 -     Cortisol -     Amb ref to Medical Nutrition Therapy-MNT  History of edema  Tremor  Other orders -     Liraglutide -Weight Management (SAXENDA) 18 MG/3ML SOPN; Inject 3 mg into the skin daily. Start 0.6mg  daily x 1 week, then increase to 1.2mg  daily x 1wk, then 1.8mg  daily x 1 wk, then 3mg  daily    Follow up: pending labs

## 2021-12-17 ENCOUNTER — Other Ambulatory Visit: Payer: Self-pay | Admitting: Medical

## 2021-12-17 LAB — TSH+FREE T4
Free T4: 1.25 ng/dL (ref 0.82–1.77)
TSH: 1.28 u[IU]/mL (ref 0.450–4.500)

## 2021-12-17 LAB — T3: T3, Total: 99 ng/dL (ref 71–180)

## 2021-12-17 LAB — CORTISOL: Cortisol: 12.8 ug/dL (ref 6.2–19.4)

## 2021-12-24 ENCOUNTER — Telehealth: Payer: Self-pay | Admitting: Medical

## 2021-12-24 NOTE — Telephone Encounter (Signed)
P.A. SAXENDA  

## 2021-12-27 ENCOUNTER — Other Ambulatory Visit (HOSPITAL_COMMUNITY): Payer: Self-pay

## 2021-12-29 ENCOUNTER — Other Ambulatory Visit (HOSPITAL_COMMUNITY): Payer: Self-pay

## 2021-12-30 ENCOUNTER — Other Ambulatory Visit (HOSPITAL_COMMUNITY): Payer: Self-pay

## 2021-12-31 ENCOUNTER — Other Ambulatory Visit (HOSPITAL_COMMUNITY): Payer: Self-pay

## 2021-12-31 ENCOUNTER — Telehealth: Payer: Self-pay | Admitting: Internal Medicine

## 2021-12-31 MED ORDER — SAXENDA 18 MG/3ML ~~LOC~~ SOPN
3.0000 mg | PEN_INJECTOR | Freq: Every day | SUBCUTANEOUS | 0 refills | Status: DC
  Filled 2021-12-31 – 2022-01-17 (×2): qty 15, 30d supply, fill #0

## 2021-12-31 NOTE — Telephone Encounter (Signed)
Pharmacy called and for the patient have to pay a low copay vs over $100 then it needs 23ml- I have submitted it for 63ml

## 2022-01-01 NOTE — Telephone Encounter (Signed)
P.A. approved til 04/17/22, faxed pharmacy, pt informed

## 2022-01-10 ENCOUNTER — Other Ambulatory Visit (HOSPITAL_COMMUNITY): Payer: Self-pay

## 2022-01-10 ENCOUNTER — Other Ambulatory Visit: Payer: Self-pay | Admitting: Medical

## 2022-01-10 MED ORDER — OMEPRAZOLE 40 MG PO CPDR
40.0000 mg | DELAYED_RELEASE_CAPSULE | Freq: Every day | ORAL | 0 refills | Status: DC
  Filled 2022-01-10: qty 90, 90d supply, fill #0

## 2022-01-11 ENCOUNTER — Other Ambulatory Visit (HOSPITAL_COMMUNITY): Payer: Self-pay

## 2022-01-12 ENCOUNTER — Other Ambulatory Visit: Payer: Self-pay | Admitting: Cardiology

## 2022-01-12 ENCOUNTER — Other Ambulatory Visit (HOSPITAL_COMMUNITY): Payer: Self-pay

## 2022-01-12 MED ORDER — NEBIVOLOL HCL 10 MG PO TABS
5.0000 mg | ORAL_TABLET | Freq: Every day | ORAL | 3 refills | Status: DC
  Filled 2022-01-12: qty 45, 90d supply, fill #0
  Filled 2022-04-05: qty 45, 90d supply, fill #1

## 2022-01-13 ENCOUNTER — Other Ambulatory Visit (HOSPITAL_COMMUNITY): Payer: Self-pay

## 2022-01-14 ENCOUNTER — Other Ambulatory Visit (HOSPITAL_COMMUNITY): Payer: Self-pay

## 2022-01-17 ENCOUNTER — Other Ambulatory Visit (HOSPITAL_COMMUNITY): Payer: Self-pay

## 2022-01-19 ENCOUNTER — Encounter: Payer: Self-pay | Admitting: Medical

## 2022-01-19 ENCOUNTER — Ambulatory Visit: Payer: 59 | Admitting: Medical

## 2022-01-19 ENCOUNTER — Other Ambulatory Visit: Payer: Self-pay

## 2022-01-19 VITALS — BP 110/70 | HR 88 | Wt 177.0 lb

## 2022-01-19 DIAGNOSIS — M79671 Pain in right foot: Secondary | ICD-10-CM

## 2022-01-19 DIAGNOSIS — Z23 Encounter for immunization: Secondary | ICD-10-CM

## 2022-01-19 DIAGNOSIS — Z7185 Encounter for immunization safety counseling: Secondary | ICD-10-CM | POA: Diagnosis not present

## 2022-01-19 DIAGNOSIS — G40909 Epilepsy, unspecified, not intractable, without status epilepticus: Secondary | ICD-10-CM | POA: Diagnosis not present

## 2022-01-19 DIAGNOSIS — K219 Gastro-esophageal reflux disease without esophagitis: Secondary | ICD-10-CM

## 2022-01-19 DIAGNOSIS — M79672 Pain in left foot: Secondary | ICD-10-CM

## 2022-01-19 DIAGNOSIS — Z0000 Encounter for general adult medical examination without abnormal findings: Secondary | ICD-10-CM

## 2022-01-19 DIAGNOSIS — Z13220 Encounter for screening for lipoid disorders: Secondary | ICD-10-CM

## 2022-01-19 DIAGNOSIS — Z9884 Bariatric surgery status: Secondary | ICD-10-CM

## 2022-01-19 DIAGNOSIS — Z6830 Body mass index (BMI) 30.0-30.9, adult: Secondary | ICD-10-CM | POA: Diagnosis not present

## 2022-01-19 NOTE — Progress Notes (Signed)
Subjective:   HPI  Latasha Ramirez is a 51 y.o. female who presents for Chief Complaint  Patient presents with   Annual Exam    Non fasting CPE. Needs prescription for pin needles for saxenda     Patient Care Team: Caylin Nass, Leward Quan as PCP - General (Family Medicine) Cameron Sprang, MD as Consulting Physician (Neurology) Sees dentist Sees eye doctor, [place] Dr. Vernell Leep, cardiology Dr. Juel Burrow, GI [facility]   Concerns: Just started Saxenda this past Monday.  Pharmacy has been out of stuff, so she is having to use other mg strength on her other medications and cut them in half.  Been waiting for Saxenda, but finally got it this week.  She has history of negative BRCA gene testing  Sees neurology for seizure disorder  Had palpation on generic bystolic, sees cardiology   Past Medical History:  Diagnosis Date   Anxiety    GERD (gastroesophageal reflux disease)    History of bleeding ulcers    Hypotension    Seizures ([place])    last seizure 2010.  last EEG 2015    Family History  Problem Relation Age of Onset   Ovarian cancer Mother    Cancer Mother 24       ovarian   Heart disease Father 11       died with MI   Diabetes Father    Diabetes Maternal Grandmother    Suicidality Maternal Grandfather    Diabetes Paternal Grandmother    Skin cancer Paternal Grandfather    Cancer Paternal Aunt        breast   Cancer Paternal Uncle        bladder   Cancer Paternal Aunt        breast     Current Outpatient Medications:    Liraglutide -Weight Management (SAXENDA) 18 MG/3ML SOPN, Inject 3 mg into the skin daily. Start 0.$RemoveBefore'6mg'ytwmKnEnTjWFV$  daily  for 1 week, then increase to 1.$RemoveBefor'2mg'eJRTooiFBHdg$  daily for 1week, then 1.$RemoveBefor'8mg'LyeaVAOrmjZl$  daily for 1 week, then $RemoveBe'3mg'oZRLzFgsm$  daily., Disp: 15 mL, Rfl: 0   Multiple Vitamin (MULTIVITAMIN) tablet, Take 1 tablet by mouth daily., Disp: , Rfl:    nebivolol (BYSTOLIC) 10 MG tablet, Take 0.5 tablets (5 mg total) by mouth daily., Disp: 90 tablet, Rfl:  3   omeprazole (PRILOSEC) 40 MG capsule, Take 1 capsule (40 mg total) by mouth daily., Disp: 90 capsule, Rfl: 0   primidone (MYSOLINE) 50 MG tablet, Take 1 tablet (50 mg total) by mouth at bedtime., Disp: 90 tablet, Rfl: 3   Topiramate ER (TROKENDI XR) 100 MG CP24, Take 1 capsule by mouth nightly, Disp: 90 capsule, Rfl: 3  Allergies  Allergen Reactions   Other Rash    Foam tape cause blisters / all adhesives cause contact rash    Adhesive [Tape]    Flexeril [Cyclobenzaprine]     Palpitations on Flexeril and Skelaxin    Levaquin [Levofloxacin]     Diarrhea, GI upset   Zoloft [Sertraline Hcl]     Heart palpitations, PVCs.  Including adverse reaction to zoloft, paxil, lexapro   Zonisamide     Moderate diarrhea   Sulfa Antibiotics Rash   Past Surgical History:  Procedure Laterality Date   CESAREAN SECTION     CHOLECYSTECTOMY     COLONOSCOPY  10/21/2019   Dr. Juel Burrow, normal colon   ESOPHAGOGASTRODUODENOSCOPY     GASTRIC BYPASS      Reviewed their medical, surgical, family, social, medication, and allergy  history and updated chart as appropriate.   Review of Systems Constitutional: -fever, -chills, -sweats, -unexpected weight change, -decreased appetite, -fatigue Allergy: -sneezing, -itching, -congestion Dermatology: -changing moles, --rash, -lumps ENT: -runny nose, -ear pain, -sore throat, -hoarseness, -sinus pain, -teeth pain, - ringing in ears, -hearing loss, -nosebleeds Cardiology: -chest pain, -palpitations, -swelling, -difficulty breathing when lying flat, -waking up short of breath Respiratory: -cough, -shortness of breath, -difficulty breathing with exercise or exertion, -wheezing, -coughing up blood Gastroenterology: -abdominal pain, -nausea, -vomiting, -diarrhea, -constipation, -blood in stool, -changes in bowel movement, -difficulty swallowing or eating Hematology: -bleeding, -bruising  Musculoskeletal: -joint aches, -muscle aches, -joint swelling, -back pain,  -neck pain, -cramping, -changes in gait Ophthalmology: denies vision changes, eye redness, itching, discharge Urology: -burning with urination, -difficulty urinating, -blood in urine, -urinary frequency, -urgency, -incontinence Neurology: -headache, -weakness, -tingling, -numbness, -memory loss, -falls, -dizziness Psychology: -depressed mood, -agitation, -sleep problems Breast/gyn: -breast tendnerss, -discharge, -lumps, -vaginal discharge,- irregular periods, -heavy periods     Objective:  BP 110/70   Pulse 88   Wt 177 lb (80.3 kg)   SpO2 98%   BMI 30.38 kg/m   Wt Readings from Last 3 Encounters:  01/19/22 177 lb (80.3 kg)  12/16/21 177 lb 3.2 oz (80.4 kg)  11/24/21 179 lb (81.2 kg)    General appearance: alert, no distress, WD/WN, Caucasian female Skin: No worrisome lesions, scattered macules HEENT: normocephalic, conjunctiva/corneas normal, sclerae anicteric, PERRLA, EOMi, nares patent, no discharge or erythema, pharynx normal Oral cavity: MMM, tongue normal, teeth normal Neck: supple, no lymphadenopathy, no thyromegaly, no masses, normal ROM, no bruits Chest: non tender, normal shape and expansion Heart: RRR, normal S1, S2, no murmurs Lungs: CTA bilaterally, no wheezes, rhonchi, or rales Abdomen: +bs, faint surgical port scars, soft, non tender, non distended, no masses, no hepatomegaly, no splenomegaly, no bruits Back: non tender, normal ROM, no scoliosis Musculoskeletal: upper extremities non tender, no obvious deformity, normal ROM throughout, lower extremities non tender, no obvious deformity, normal ROM throughout Extremities: no edema, no cyanosis, no clubbing Pulses: 2+ symmetric, upper and lower extremities, normal cap refill Neurological: alert, oriented x 3, CN2-12 intact, strength normal upper extremities and lower extremities, sensation normal throughout, DTRs 2+ throughout, no cerebellar signs, gait normal Psychiatric: normal affect, behavior normal, pleasant   Breast/gyn/rectal - deferred to gynecology     Assessment and Plan :   Encounter Diagnoses  Name Primary?   Need for shingles vaccine Yes   Encounter for health maintenance examination in adult    BMI 30.0-30.9,adult    Vaccine counseling    Seizure disorder ([place])    H/O gastric bypass    Gastroesophageal reflux disease, unspecified whether esophagitis present    Foot pain, bilateral    Screening for lipid disorders      Today you had a preventative care visit or wellness visit.    Topics today may have included healthy lifestyle, diet, exercise, preventative care, vaccinations, sick and well care, proper use of emergency dept and after hours care, as well as other concerns.     Recommendations: Continue to return yearly for your annual wellness and preventative care visits.  This gives [place] a chance to discuss healthy lifestyle, exercise, vaccinations, review your chart record, and perform screenings where appropriate.  I recommend you see your eye doctor yearly for routine vision care.  I recommend you see your dentist yearly for routine dental care including hygiene visits twice yearly.  See your gynecologist yearly for routine gynecological care.  Vaccination recommendations were reviewed  I recommend a yearly flu shot  Counseled on the Shingrix vaccine.  Vaccine information sheet given. Shingrix #1 vaccine given after consent obtained.   Return in 2 months for Shingrix #2.   Screening for cancer: Breast cancer screening: You should perform a self breast exam monthly.   We reviewed recommendations for regular mammograms and breast cancer screening. Breast MRI bilat recommended in 05/2022  Colon cancer screening:  I reviewed your colonoscopy on file that is up to date from 2021  Cervical cancer screening: We reviewed recommendations for pap smear screening.  Skin cancer screening: Check your skin regularly for new changes, growing lesions, or other lesions  of concern Come in for evaluation if you have skin lesions of concern.  Lung cancer screening: If you have a greater than 30 pack year history of tobacco use, then you qualify for lung cancer screening with a chest CT scan  We currently don't have screenings for other cancers besides breast, cervical, colon, and lung cancers.  If you have a strong family history of cancer or have other cancer screening concerns, please let me know.    Bone health: Get at least 150 minutes of aerobic exercise weekly Get weight bearing exercise at least once weekly   Heart health: Get at least 150 minutes of aerobic exercise weekly Limit alcohol It is important to maintain a healthy blood pressure and healthy cholesterol numbers    Separate significant issues discussed: Seizure disorder- managed by neurology, compliant with medication  Essential tremor- on medicine per neurology  PVCs, sees cardiology no recent complaint  Obesity - just started Saxenda this week, working with lifestyle changes to lose weight.  Charlynn was seen today for annual exam.  Diagnoses and all orders for this visit:  Need for shingles vaccine  Encounter for health maintenance examination in adult -     CBC -     Lipid panel -     Comprehensive metabolic panel  BMI 25.7-50.5,XGZFP  Vaccine counseling  Seizure disorder ([place])  H/O gastric bypass  Gastroesophageal reflux disease, unspecified whether esophagitis present  Foot pain, bilateral  Screening for lipid disorders -     Lipid panel    Follow-up pending labs, yearly for physical

## 2022-01-20 ENCOUNTER — Other Ambulatory Visit: Payer: Self-pay | Admitting: Medical

## 2022-01-20 ENCOUNTER — Other Ambulatory Visit (HOSPITAL_COMMUNITY): Payer: Self-pay

## 2022-01-20 LAB — CBC
Hematocrit: 35.5 % (ref 34.0–46.6)
Hemoglobin: 11.6 g/dL (ref 11.1–15.9)
MCH: 29 pg (ref 26.6–33.0)
MCHC: 32.7 g/dL (ref 31.5–35.7)
MCV: 89 fL (ref 79–97)
Platelets: 226 10*3/uL (ref 150–450)
RBC: 4 x10E6/uL (ref 3.77–5.28)
RDW: 12.9 % (ref 11.7–15.4)
WBC: 8.9 10*3/uL (ref 3.4–10.8)

## 2022-01-20 LAB — LIPID PANEL
Chol/HDL Ratio: 2.2 ratio (ref 0.0–4.4)
Cholesterol, Total: 156 mg/dL (ref 100–199)
HDL: 71 mg/dL (ref >39)
LDL Chol Calc (NIH): 65 mg/dL (ref 0–99)
Triglycerides: 111 mg/dL (ref 0–149)
VLDL Cholesterol Cal: 20 mg/dL (ref 5–40)

## 2022-01-20 LAB — COMPREHENSIVE METABOLIC PANEL
ALT: 17 IU/L (ref 0–32)
AST: 19 IU/L (ref 0–40)
Albumin/Globulin Ratio: 1.9 (ref 1.2–2.2)
Albumin: 4.5 g/dL (ref 3.9–4.9)
Alkaline Phosphatase: 67 IU/L (ref 44–121)
BUN/Creatinine Ratio: 20 (ref 9–23)
BUN: 20 mg/dL (ref 6–24)
Bilirubin Total: 0.2 mg/dL (ref 0.0–1.2)
CO2: 22 mmol/L (ref 20–29)
Calcium: 10.9 mg/dL — ABNORMAL HIGH (ref 8.7–10.2)
Chloride: 104 mmol/L (ref 96–106)
Creatinine, Ser: 1.02 mg/dL — ABNORMAL HIGH (ref 0.57–1.00)
Globulin, Total: 2.4 g/dL (ref 1.5–4.5)
Glucose: 88 mg/dL (ref 70–99)
Potassium: 4.7 mmol/L (ref 3.5–5.2)
Sodium: 139 mmol/L (ref 134–144)
Total Protein: 6.9 g/dL (ref 6.0–8.5)
eGFR: 67 mL/min/{1.73_m2} (ref >59)

## 2022-01-20 MED ORDER — BD PEN NEEDLE NANO U/F 32G X 4 MM MISC
1.0000 | Freq: Every day | 1 refills | Status: DC
  Filled 2022-01-20 – 2022-07-08 (×3): qty 100, 90d supply, fill #0
  Filled 2022-07-08: qty 100, 90d supply, fill #1

## 2022-01-20 MED ORDER — FLUCONAZOLE 150 MG PO TABS
150.0000 mg | ORAL_TABLET | ORAL | 1 refills | Status: DC
  Filled 2022-01-20: qty 2, 2d supply, fill #0
  Filled 2022-04-05: qty 2, 2d supply, fill #1

## 2022-01-24 ENCOUNTER — Other Ambulatory Visit (HOSPITAL_COMMUNITY): Payer: Self-pay

## 2022-01-24 MED ORDER — TRETINOIN 0.1 % EX CREA
1.0000 "application " | TOPICAL_CREAM | CUTANEOUS | 1 refills | Status: DC
  Filled 2022-01-24: qty 45, 30d supply, fill #0

## 2022-01-25 ENCOUNTER — Other Ambulatory Visit (HOSPITAL_COMMUNITY): Payer: Self-pay

## 2022-02-14 ENCOUNTER — Other Ambulatory Visit (HOSPITAL_COMMUNITY): Payer: Self-pay

## 2022-02-18 ENCOUNTER — Other Ambulatory Visit (HOSPITAL_COMMUNITY): Payer: Self-pay

## 2022-02-22 ENCOUNTER — Other Ambulatory Visit: Payer: Self-pay | Admitting: Medical

## 2022-02-22 ENCOUNTER — Other Ambulatory Visit (HOSPITAL_COMMUNITY): Payer: Self-pay

## 2022-02-22 MED ORDER — SAXENDA 18 MG/3ML ~~LOC~~ SOPN
3.0000 mg | PEN_INJECTOR | Freq: Every day | SUBCUTANEOUS | 0 refills | Status: DC
Start: 2022-02-22 — End: 2022-02-25
  Filled 2022-02-22: qty 15, 30d supply, fill #0

## 2022-02-25 ENCOUNTER — Other Ambulatory Visit (HOSPITAL_COMMUNITY): Payer: Self-pay

## 2022-02-25 ENCOUNTER — Encounter: Payer: Self-pay | Admitting: Medical

## 2022-02-25 ENCOUNTER — Ambulatory Visit: Payer: 59 | Admitting: Medical

## 2022-02-25 VITALS — BP 110/66 | HR 88 | Temp 98.8°F | Wt 169.8 lb

## 2022-02-25 DIAGNOSIS — Z6829 Body mass index (BMI) 29.0-29.9, adult: Secondary | ICD-10-CM

## 2022-02-25 DIAGNOSIS — L729 Follicular cyst of the skin and subcutaneous tissue, unspecified: Secondary | ICD-10-CM | POA: Diagnosis not present

## 2022-02-25 MED ORDER — SAXENDA 18 MG/3ML ~~LOC~~ SOPN
3.0000 mg | PEN_INJECTOR | Freq: Every day | SUBCUTANEOUS | 2 refills | Status: DC
  Filled 2022-02-25: qty 9, 18d supply, fill #0
  Filled 2022-03-10: qty 15, 30d supply, fill #1
  Filled 2022-04-05: qty 15, 30d supply, fill #2
  Filled 2022-05-03: qty 15, 30d supply, fill #3

## 2022-02-25 MED ORDER — SAXENDA 18 MG/3ML ~~LOC~~ SOPN: 3.0000 mg | PEN_INJECTOR | Freq: Every day | SUBCUTANEOUS | 0 refills | Status: DC

## 2022-02-25 NOTE — Progress Notes (Signed)
Subjective:  Latasha Ramirez is a 51 y.o. female who presents for Chief Complaint  Patient presents with   Cyst    Back of left hip.      Here for concerns of cyst of skin.  She has felt something in her left hip/upper buttock may be within the last 3 to 4 weeks.  It was the size of a marble initially but so far today she is having trouble finding it.  It is almost like it started to disappear.  It is only mildly tender sometimes if she rolls over on that area or touches it and presses in.  Otherwise nontender, no redness, no drainage, no pain or warmth otherwise.  No other lumps or bumps.  She is continue her efforts to lose weight through diet, exercise and Saxenda.  She is on the 3 mg of Saxenda and doing fine on this.  She continues to lose weight.  No other aggravating or relieving factors.    No other c/o.  The following portions of the patient's history were reviewed and updated as appropriate: allergies, current medications, past family history, past medical history, past social history, past surgical history and problem list.  ROS Otherwise as in subjective above  Objective: BP 110/66   Pulse 88   Temp 98.8 F (37.1 C)   Wt 169 lb 12.8 oz (77 kg)   LMP 01/28/2022   SpO2 99%   BMI 29.15 kg/m   General appearance: alert, no distress, well developed, well nourished Palpated the left hip and upper buttock region and there was no specific nodule or lump found.  There is 1 little small area that seems like connective tissue but no lump that she was describing and she cannot seem to locate it today either.  No other unusual findings shallow or deep within the tissue No inguinal lymphadenopathy palpated Hip normal range of motion, leg normal range of motion without abnormality Exam chaperoned by nurse    Assessment: Encounter Diagnoses  Name Primary?   BMI 29.0-29.9,adult Yes   Cyst of skin      Plan: Cyst of skin-reassured.  No abnormality found today and she cannot  find it either.  It seems to have resolved.  We discussed common lumps that we see sometimes including lymph nodes, muscle spasms, cysts, lipomas, or more worrisome tumors.  This more than likely was a cyst given the history  BMI 29-she is doing well on Saxenda.  Discussed proper use of the pen device.  Continue efforts to lose weight through healthy diet and exercise  Abnormal mammogram-I reviewed her last mammogram.  She is  every 58-month scan currently  Latasha Ramirez was seen today for cyst.  Diagnoses and all orders for this visit:  BMI 29.0-29.9,adult  Cyst of skin  Other orders -     Liraglutide -Weight Management (SAXENDA) 18 MG/3ML SOPN; Inject 3 mg into the skin daily.   Follow up: prn

## 2022-02-25 NOTE — Addendum Note (Signed)
Addended by: Jac Canavan on: 02/25/2022 04:13 PM   Modules accepted: Orders

## 2022-03-01 ENCOUNTER — Ambulatory Visit: Payer: 59 | Admitting: Skilled Nursing Facility1

## 2022-03-11 ENCOUNTER — Other Ambulatory Visit (HOSPITAL_COMMUNITY): Payer: Self-pay

## 2022-03-11 ENCOUNTER — Encounter: Payer: 59 | Attending: Medical | Admitting: Dietician

## 2022-03-11 DIAGNOSIS — R635 Abnormal weight gain: Secondary | ICD-10-CM | POA: Diagnosis not present

## 2022-03-11 NOTE — Patient Instructions (Addendum)
Aim for 150 minutes of physical activity weekly -30 minute walk 5x/wk  Eat more Non-Starchy Vegetables These include greens, broccoli, cauliflower, cabbage, carrots, beets, eggplant, peppers, squash and others Consider making a smoothie for breakfast with your fairlife shake, 1 frozen banana, and a handful or two of spinach.  Minimize added sugars and refined grains  Choose whole foods over processed.  Make simple meals at home more often than eating out.  Starbucks swaps Venti Salted Caramel Cold Brew- ask for sugar free vanilla syrup instead of regular  Try to add fruits and veggies to your meals whenever possible- even if just a few bites!

## 2022-03-11 NOTE — Progress Notes (Signed)
Medical Nutrition Therapy  Appointment Start time:  [phone]  Appointment End time:  [phone]  Primary concerns today: Pt states she is trying to lose weight and wants to make sure she's getting enough protein and nutrients in.   Referral diagnosis: weight gain Preferred learning style: no preference indicated Learning readiness: ready   NUTRITION ASSESSMENT   Anthropometrics  Ht: 64in Wt: 168.3lbs  Clinical Medical Hx: anxiety, GERD, seizures, gastric bypass Medications: reviewed- saxenda Labs: reviewed Notable Signs/Symptoms: reduced appetite with saxenda  Lifestyle & Dietary Hx  Pt has hx of gastric bypass surgery and states that the combination of this and saxenda weight loss medication causes her to become full very quickly. She states she is worried she is not getting enough nutrients in.   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 Pt states that when she works from home she is more sedentary.   Pt lives with her husband. They cook at home 3x/wk and eat out for dinner the other nights. Pt gets starbucks protein pack for lunch if on site, and eats at home if working from home.   Estimated daily fluid intake: 64 oz Supplements: MVI, hair skin & nails, 5-HTP Sleep: 7 hours Stress / self-care: moderate stress Current average weekly physical activity: 30 minute walk 3-4 nights a week  24-Hr Dietary Recall First Meal: fairlife protein shake Snack: none Second Meal: starbucks protein snack pack  Snack: none OR nuts OR crackers Third Meal: 1/2 avocado BLT with 1/2 bag cheetos OR can of soup with cheese OR air fryer chicken and broccoli OR panera butternut squash soup and bread Snack: occasional dessert Beverages: venti salted caramel cold brew 3x/wk OR coffee with SF creamer, 64oz water with mio flavor, occasional diet pepsi,    NUTRITION DIAGNOSIS  NB-1.1 Food and nutrition-related knowledge deficit As related to weight gain.  As evidenced by pt report and diet  history.   NUTRITION INTERVENTION  Nutrition education (E-1) on the following topics:  MyPlate Importance of adequate hydration Building balance snacks Balance of protein, carbohydrate and non-starchy vegetables Importance of reducing added sugar intake Physical activity goals  Handouts Provided Include  Dish Up a Healthy Meal Snack Suggestions  Learning Style & Readiness for Change Teaching method utilized: Visual & Auditory  Demonstrated degree of understanding via: Teach Back  Barriers to learning/adherence to lifestyle change: none  Goals Established by Pt Aim for 150 minutes of physical activity weekly -30 minute walk 5x/wk  Eat more Non-Starchy Vegetables Consider making a smoothie for breakfast with your fairlife shake, 1 frozen banana, and a handful or two of spinach.  Minimize added sugars and refined grains  Choose whole foods over processed.  Make simple meals at home more often than eating out.  Starbucks swaps Venti Salted Caramel Cold Brew- ask for sugar free vanilla syrup instead of regular  Try to add fruits and veggies to your meals whenever possible- even if just a few bites!   MONITORING & EVALUATION Dietary intake, weekly physical activity, and follow up as needed.  Next Steps  Patient is to call for questions.

## 2022-03-28 ENCOUNTER — Other Ambulatory Visit (HOSPITAL_BASED_OUTPATIENT_CLINIC_OR_DEPARTMENT_OTHER): Payer: Self-pay

## 2022-04-05 ENCOUNTER — Other Ambulatory Visit: Payer: Self-pay | Admitting: Cardiology

## 2022-04-05 ENCOUNTER — Other Ambulatory Visit (HOSPITAL_COMMUNITY): Payer: Self-pay

## 2022-04-05 ENCOUNTER — Encounter: Payer: Self-pay | Admitting: Internal Medicine

## 2022-04-05 ENCOUNTER — Other Ambulatory Visit: Payer: Self-pay | Admitting: Medical

## 2022-04-05 MED ORDER — NEBIVOLOL HCL 5 MG PO TABS
5.0000 mg | ORAL_TABLET | Freq: Every day | ORAL | 3 refills | Status: DC
  Filled 2022-04-05: qty 90, 90d supply, fill #0

## 2022-04-05 MED ORDER — OMEPRAZOLE 40 MG PO CPDR
40.0000 mg | DELAYED_RELEASE_CAPSULE | Freq: Every day | ORAL | 1 refills | Status: DC
  Filled 2022-04-05: qty 90, 90d supply, fill #0
  Filled 2022-07-08: qty 90, 90d supply, fill #1
  Filled 2022-07-08: qty 90, 90d supply, fill #0

## 2022-04-06 ENCOUNTER — Other Ambulatory Visit (HOSPITAL_COMMUNITY): Payer: Self-pay

## 2022-04-07 ENCOUNTER — Other Ambulatory Visit (HOSPITAL_COMMUNITY): Payer: Self-pay

## 2022-04-09 ENCOUNTER — Telehealth: Payer: Self-pay | Admitting: Medical

## 2022-04-09 NOTE — Telephone Encounter (Signed)
P.A. SAXENDA  

## 2022-04-11 ENCOUNTER — Other Ambulatory Visit (HOSPITAL_COMMUNITY): Payer: Self-pay

## 2022-04-16 NOTE — Telephone Encounter (Signed)
P.A. approved til 04/12/23, sent mychart message

## 2022-04-28 ENCOUNTER — Other Ambulatory Visit (HOSPITAL_BASED_OUTPATIENT_CLINIC_OR_DEPARTMENT_OTHER): Payer: Self-pay

## 2022-05-03 ENCOUNTER — Other Ambulatory Visit (HOSPITAL_BASED_OUTPATIENT_CLINIC_OR_DEPARTMENT_OTHER): Payer: Self-pay

## 2022-05-03 ENCOUNTER — Other Ambulatory Visit: Payer: Self-pay | Admitting: Medical

## 2022-05-03 MED ORDER — SAXENDA 18 MG/3ML ~~LOC~~ SOPN
3.0000 mg | PEN_INJECTOR | Freq: Every day | SUBCUTANEOUS | 2 refills | Status: DC
  Filled 2022-05-03: qty 15, 30d supply, fill #0
  Filled 2022-06-05: qty 15, 30d supply, fill #1
  Filled 2022-07-08 – 2022-07-13 (×4): qty 15, 30d supply, fill #2

## 2022-05-04 ENCOUNTER — Other Ambulatory Visit (HOSPITAL_BASED_OUTPATIENT_CLINIC_OR_DEPARTMENT_OTHER): Payer: Self-pay

## 2022-05-11 ENCOUNTER — Encounter: Payer: Self-pay | Admitting: Cardiology

## 2022-05-11 ENCOUNTER — Ambulatory Visit: Payer: 59 | Admitting: Cardiology

## 2022-05-11 ENCOUNTER — Other Ambulatory Visit (HOSPITAL_COMMUNITY): Payer: Self-pay

## 2022-05-11 VITALS — BP 104/71 | HR 103 | Resp 14 | Ht 65.0 in | Wt 163.4 lb

## 2022-05-11 DIAGNOSIS — I493 Ventricular premature depolarization: Secondary | ICD-10-CM | POA: Diagnosis not present

## 2022-05-11 MED ORDER — NEBIVOLOL HCL 5 MG PO TABS
5.0000 mg | ORAL_TABLET | Freq: Every day | ORAL | 3 refills | Status: DC
  Filled 2022-05-11 – 2022-07-08 (×3): qty 90, 90d supply, fill #0
  Filled 2022-10-05: qty 90, 90d supply, fill #1
  Filled 2022-12-20: qty 90, 90d supply, fill #2
  Filled 2023-04-24: qty 90, 90d supply, fill #3

## 2022-05-11 NOTE — Progress Notes (Signed)
Patient referred by Carlena Hurl, PA-C for palpitations  Subjective:   Latasha Ramirez, female    DOB: Jul 27, 1970, 51 y.o.   MRN: [id number]   Chief Complaint  Patient presents with   Follow-up    6 month   Symptomatic PVCs    HPI  51 y.o. Caucasian female with symptomatic PVC's  Patient is doing well, denies chest pain, shortness of breath, palpitations, leg edema, orthopnea, PND, TIA/syncope. She has occasional leg swelling when she sits at the desk for long time at work.   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 She moved from [place] in 2020. She was last seen by a cardiologist in [facility], Dr. Forestine Chute and diagnosed with symptomatic PVC's. She was initially put on Toprol, later switched to Bystolic due to hypotension. She did really well on bystolic until it was switched to generic nebivolol. Since then, patient's symptoms of palpitations have worsened, She is requiring to take 2 pills instead of 1, which has correlated with further hypotension. She denies chest pain, shortness of breath, leg edema, orthopnea, PND, TIA/syncope.    Current Outpatient Medications:    Insulin Pen Needle (BD PEN NEEDLE NANO U/F) 32G X 4 MM MISC, use 1 pen needle with insulin at bedtime, Disp: 100 each, Rfl: 1   Liraglutide -Weight Management (SAXENDA) 18 MG/3ML SOPN, Inject 3 mg into the skin daily., Disp: 15 mL, Rfl: 2   Multiple Vitamin (MULTIVITAMIN) tablet, Take 1 tablet by mouth daily., Disp: , Rfl:    nebivolol (BYSTOLIC) 5 MG tablet, Take 1 tablet (5 mg total) by mouth daily., Disp: 90 tablet, Rfl: 3   omeprazole (PRILOSEC) 40 MG capsule, Take 1 capsule (40 mg total) by mouth daily., Disp: 90 capsule, Rfl: 1   primidone (MYSOLINE) 50 MG tablet, Take 1 tablet (50 mg total) by mouth at bedtime., Disp: 90 tablet, Rfl: 3   Topiramate ER (TROKENDI XR) 100 MG CP24, Take 1 capsule by mouth nightly, Disp: 90 capsule, Rfl: 3   tretinoin  (RETIN-A) 0.1 % cream, Apply 1 application topically as directed., Disp: 45 g, Rfl: 1   ALPRAZolam (XANAX) 0.25 MG tablet, Take 0.25 mg by mouth., Disp: , Rfl:     Cardiovascular and other pertinent studies:  EKG 05/11/2022: Sinus rhythm 73 bpm Normal EKG  Recent labs: 07/14/2020: Glucose 97, BUN/Cr 9/0.92. EGFR >60. Na/K 136/4.0. Rest of the CMP normal H/H 13/41. MCV 95. Platelets 171 HbA1C N/A Trop HS 3  10/2019: Chol 151, TG 50, HDL 76, LDL 64 TSH N/A   Review of Systems  Constitutional: Positive for weight gain.  Cardiovascular:  Positive for leg swelling and palpitations. Negative for chest pain, dyspnea on exertion and syncope.  Neurological:  Positive for light-headedness.         Vitals:   05/11/22 1309  BP: 104/71  Pulse: (!) 103  Resp: 14  SpO2: 100%     Body mass index is 27.19 kg/m. Filed Weights   05/11/22 1309  Weight: 163 lb 6.4 oz (74.1 kg)     Objective:   Physical Exam Vitals and nursing note reviewed.  Constitutional:      General: She is not in acute distress. Neck:     Vascular: No JVD.  Cardiovascular:     Rate and Rhythm: Normal rate and regular rhythm.     Pulses: Normal pulses.     Heart sounds: Normal heart sounds. No murmur heard. Pulmonary:  Effort: Pulmonary effort is normal.     Breath sounds: Normal breath sounds. No wheezing or rales.  Musculoskeletal:     Right lower leg: Edema (Trace) present.     Left lower leg: Edema (Trace) present.          Assessment & Recommendations:   51 y.o. Caucasian female with symptomatic PVC's  Symptomatic PVC's: Revert back to Bystolic, instead of propranolol (due to side effects) or generic nebivolol (which she has not tolerated).  Leg swelling: Occasional. Okay to use compression stockings.  F/u in 6 months   Nigel Mormon, MD Pager: [phone] Office: [phone]

## 2022-05-12 ENCOUNTER — Other Ambulatory Visit (HOSPITAL_BASED_OUTPATIENT_CLINIC_OR_DEPARTMENT_OTHER): Payer: Self-pay

## 2022-05-12 ENCOUNTER — Telehealth: Payer: 59 | Admitting: Family Medicine

## 2022-05-12 DIAGNOSIS — B379 Candidiasis, unspecified: Secondary | ICD-10-CM | POA: Diagnosis not present

## 2022-05-12 DIAGNOSIS — J029 Acute pharyngitis, unspecified: Secondary | ICD-10-CM

## 2022-05-12 DIAGNOSIS — T3695XA Adverse effect of unspecified systemic antibiotic, initial encounter: Secondary | ICD-10-CM | POA: Diagnosis not present

## 2022-05-12 MED ORDER — AMOXICILLIN 500 MG PO CAPS
500.0000 mg | ORAL_CAPSULE | Freq: Two times a day (BID) | ORAL | 0 refills | Status: AC
  Filled 2022-05-12: qty 20, 10d supply, fill #0

## 2022-05-12 MED ORDER — FLUCONAZOLE 150 MG PO TABS
150.0000 mg | ORAL_TABLET | ORAL | 0 refills | Status: DC
  Filled 2022-05-12: qty 2, 3d supply, fill #0

## 2022-05-12 NOTE — Patient Instructions (Addendum)
Latasha Ramirez, thank you for joining Freddy Finner, NP for today's virtual visit.  While this provider is not your primary care provider (PCP), if your PCP is located in our provider database this encounter information will be shared with them immediately following your visit.   A [facility] MyChart account gives you access to today's visit and all your visits, tests, and labs performed at [facility] " click here if you don't have a [facility] MyChart account or go to [email or web address]  Consent: (Patient) Latasha Ramirez provided verbal consent for this virtual visit at the beginning of the encounter.  Current Medications:  Current Outpatient Medications:    amoxicillin (AMOXIL) 500 MG tablet, Take 1 tablet (500 mg total) by mouth 2 (two) times daily for 10 days., Disp: 20 tablet, Rfl: 0   fluconazole (DIFLUCAN) 150 MG tablet, Take 1 tablet (150 mg total) by mouth as directed., Disp: 2 tablet, Rfl: 0   ALPRAZolam (XANAX) 0.25 MG tablet, Take 0.25 mg by mouth., Disp: , Rfl:    Insulin Pen Needle (BD PEN NEEDLE NANO U/F) 32G X 4 MM MISC, use 1 pen needle with insulin at bedtime, Disp: 100 each, Rfl: 1   Liraglutide -Weight Management (SAXENDA) 18 MG/3ML SOPN, Inject 3 mg into the skin daily., Disp: 15 mL, Rfl: 2   Multiple Vitamin (MULTIVITAMIN) tablet, Take 1 tablet by mouth daily., Disp: , Rfl:    nebivolol (BYSTOLIC) 5 MG tablet, Take 1 tablet (5 mg total) by mouth daily., Disp: 90 tablet, Rfl: 3   omeprazole (PRILOSEC) 40 MG capsule, Take 1 capsule (40 mg total) by mouth daily., Disp: 90 capsule, Rfl: 1   primidone (MYSOLINE) 50 MG tablet, Take 1 tablet (50 mg total) by mouth at bedtime., Disp: 90 tablet, Rfl: 3   Topiramate ER (TROKENDI XR) 100 MG CP24, Take 1 capsule by mouth nightly, Disp: 90 capsule, Rfl: 3   tretinoin (RETIN-A) 0.1 % cream, Apply 1 application topically as directed., Disp: 45 g, Rfl: 1   Medications ordered in this encounter:  Meds ordered this  encounter  Medications   amoxicillin (AMOXIL) 500 MG tablet    Sig: Take 1 tablet (500 mg total) by mouth 2 (two) times daily for 10 days.    Dispense:  20 tablet    Refill:  0    Order Specific Question:   Supervising Provider    Answer:   Merrilee Jansky [[id number]]   fluconazole (DIFLUCAN) 150 MG tablet    Sig: Take 1 tablet (150 mg total) by mouth as directed.    Dispense:  2 tablet    Refill:  0    Order Specific Question:   Supervising Provider    Answer:   Merrilee Jansky [id number]     *If you need refills on other medications prior to your next appointment, please contact your pharmacy*  Follow-Up: Call back or seek an in-person evaluation if the symptoms worsen or if the condition fails to improve as anticipated.  [facility] [phone]  Other Instructions  Pharyngitis  Pharyngitis is a sore throat (pharynx). This is when there is redness, pain, and swelling in your throat. Most of the time, this condition gets better on its own. In some cases, you may need medicine. What are the causes? An infection from a virus. An infection from bacteria. Allergies. What increases the risk? Being 11-21 years old. Being in crowded environments. These include: Daycares. Schools. Dormitories. Living in a place with cold  temperatures outside. Having a weakened disease-fighting (immune) system. What are the signs or symptoms? Symptoms may vary depending on the cause. Common symptoms include: Sore throat. Tiredness (fatigue). Low-grade fever. Stuffy nose. Cough. Headache. Other symptoms may include: Glands in the neck (lymph nodes) that are swollen. Skin rashes. Film on the throat or tonsils. This can be caused by an infection from bacteria. Vomiting. Red, itchy eyes. Loss of appetite. Joint pain and muscle aches. Tonsils that are temporarily bigger than usual (enlarged). How is this treated? Many times, treatment is not needed. This condition usually  gets better in 3-4 days without treatment. If the infection is caused by a bacteria, you may be need to take antibiotics. Follow these instructions at home: Medicines Take over-the-counter and prescription medicines only as told by your doctor. If you were prescribed an antibiotic medicine, take it as told by your doctor. Do not stop taking the antibiotic even if you start to feel better. Use throat lozenges or sprays to soothe your throat as told by your doctor. Children can get pharyngitis. Do not give your child aspirin. Managing pain To help with pain, try: Sipping warm liquids, such as: Broth. Herbal tea. Warm water. Eating or drinking cold or frozen liquids, such as frozen ice pops. Rinsing your mouth (gargle) with a salt water mixture 3-4 times a day or as needed. To make salt water, dissolve -1 tsp (3-6 g) of salt in 1 cup (237 mL) of warm water. Do not swallow this mixture. Sucking on hard candy or throat lozenges. Putting a cool-mist humidifier in your bedroom at night to moisten the air. Sitting in the bathroom with the door closed for 5-10 minutes while you run hot water in the shower.  General instructions  Do not smoke or use any products that contain nicotine or tobacco. If you need help quitting, ask your doctor. Rest as told by your doctor. Drink enough fluid to keep your pee (urine) pale yellow. How is this prevented? Wash your hands often for at least 20 seconds with soap and water. If soap and water are not available, use hand sanitizer. Do not touch your eyes, nose, or mouth with unwashed hands. Wash hands after touching these areas. Do not share cups or eating utensils. Avoid close contact with people who are sick. Contact a doctor if: You have large, tender lumps in your neck. You have a rash. You cough up green, yellow-brown, or bloody spit. Get help right away if: You have a stiff neck. You drool or cannot swallow liquids. You cannot drink or take  medicines without vomiting. You have very bad pain that does not go away with medicine. You have problems breathing, and it is not from a stuffy nose. You have new pain and swelling in your knees, ankles, wrists, or elbows. These symptoms may be an emergency. Get help right away. Call your local emergency services (911 in the [place] Do not wait to see if the symptoms will go away. Do not drive yourself to the hospital. Summary Pharyngitis is a sore throat (pharynx). This is when there is redness, pain, and swelling in your throat. Most of the time, pharyngitis gets better on its own. Sometimes, you may need medicine. If you were prescribed an antibiotic medicine, take it as told by your doctor. Do not stop taking the antibiotic even if you start to feel better. This information is not intended to replace advice given to you by your health care provider. Make sure you discuss any  questions you have with your health care provider. Document Revised: 09/09/2020 Document Reviewed: 09/09/2020 Elsevier Patient Education  2023 Elsevier Inc.   If you have been instructed to have an in-person evaluation today at a local Urgent Care facility, please use the link below. It will take you to a list of all of our available [facility], including address, phone number and hours of operation. Please do not delay care.  [facility]  If you or a family member do not have a primary care provider, use the link below to schedule a visit and establish care. When you choose a [facility] primary care physician or advanced practice provider, you gain a long-term partner in health. Find a Primary Care Provider  Learn more about [facility]'s in-office and virtual care options:  - Get Care Now

## 2022-05-12 NOTE — Progress Notes (Signed)
Virtual Visit Consent   Latasha Ramirez, you are scheduled for a virtual visit with a [facility] provider today. Just as with appointments in the office, your consent must be obtained to participate. Your consent will be active for this visit and any virtual visit you may have with one of our providers in the next 365 days. If you have a MyChart account, a copy of this consent can be sent to you electronically.  As this is a virtual visit, video technology does not allow for your provider to perform a traditional examination. This may limit your provider's ability to fully assess your condition. If your provider identifies any concerns that need to be evaluated in person or the need to arrange testing (such as labs, EKG, etc.), we will make arrangements to do so. Although advances in technology are sophisticated, we cannot ensure that it will always work on either your end or our end. If the connection with a video visit is poor, the visit may have to be switched to a telephone visit. With either a video or telephone visit, we are not always able to ensure that we have a secure connection.  By engaging in this virtual visit, you consent to the provision of healthcare and authorize for your insurance to be billed (if applicable) for the services provided during this visit. Depending on your insurance coverage, you may receive a charge related to this service.  I need to obtain your verbal consent now. Are you willing to proceed with your visit today? Latasha Ramirez has provided verbal consent on 05/12/2022 for a virtual visit (video or telephone). Freddy Finner, NP  Date: 05/12/2022 12:20 PM  Virtual Visit via Video Note   I, Freddy Finner, connected with  Latasha Ramirez  ([id number], [birth year]) on 05/12/22 at 12:45 PM EST by a video-enabled telemedicine application and verified that I am speaking with the correct person using two identifiers.  Location: Patient: Virtual Visit Location Patient:  Home Provider: Virtual Visit Location Provider: Home Office   I discussed the limitations of evaluation and management by telemedicine and the availability of in person appointments. The patient expressed understanding and agreed to proceed.    History of Present Illness: Latasha Ramirez is a 51 y.o. who identifies as a female who was assigned female at birth, and is being seen today for coughing, URI symptoms.  Provider looked at her throat and thought it might be strep. Also noted some ears, with some cloudiness.  HPI: Sinus Problem This is a new problem. The current episode started in the past 7 days. The problem has been gradually worsening since onset. There has been no fever. The fever has been present for Less than 1 day. Pain scale: right throat swelling. The pain is moderate. Associated symptoms include congestion, coughing, headaches, a sore throat and swollen glands. Pertinent negatives include no chills, diaphoresis, ear pain, hoarse voice, neck pain, shortness of breath, sinus pressure or sneezing. Past treatments include nothing.  Sore Throat  This is a new problem. The current episode started in the past 7 days. The problem has been gradually worsening. The pain is worse on the right side. There has been no fever. The fever has been present for Less than 1 day. Associated symptoms include congestion, coughing, headaches and swollen glands. Pertinent negatives include no ear pain, hoarse voice, neck pain or shortness of breath. She has had no exposure to strep or mono. Exposure to: granddaughter was sick. The treatment provided no relief.  Problems:  Patient Active Problem List   Diagnosis Date Noted   BMI 30.0-30.9,adult 12/16/2021   Swelling 10/13/2021   Low energy 10/13/2021   Weight gain 10/13/2021   Seasonal allergic rhinitis due to pollen 10/13/2021   Anxiety 06/09/2021   Work stress 06/09/2021   Overweight 01/04/2021   Encounter for health maintenance examination in  adult 10/30/2020   Family history of premature CAD 10/30/2020   Vaccine counseling 10/30/2020   Hot flashes 10/30/2020   Screening for cervical cancer 09/28/2020   Screen for colon cancer 09/28/2020   History of seizure 09/28/2020   History of gastric bypass 09/28/2020   Leg cramping 08/19/2020   Foot pain, bilateral 08/19/2020   Decreased pulses in feet 08/19/2020   Family history of dementia 08/19/2020   Neck pain 07/15/2020   Abdominal discomfort 07/15/2020   History of colitis 07/15/2020   Morning headache 07/15/2020   Low libido 07/15/2020   Snoring 07/15/2020   Vision changes 07/15/2020   Symptomatic PVCs 07/15/2020   Palpitations 07/15/2020   Nonintractable headache 02/26/2019   Bilateral adhesive capsulitis of shoulders 10/25/2017   Essential tremor 01/08/2016   Increased risk of breast cancer 02/18/2014   Family history of breast cancer 12/25/2013   Family history of ovarian cancer 12/25/2013   GERD (gastroesophageal reflux disease) 11/10/2013   B12 deficiency 11/10/2013   H/O gastric bypass 11/10/2013   Seizure disorder (HCC) 11/10/2013    Allergies:  Allergies  Allergen Reactions   Other Rash    Foam tape cause blisters / all adhesives cause contact rash    Adhesive [Tape]    Flexeril [Cyclobenzaprine]     Palpitations on Flexeril and Skelaxin    Levaquin [Levofloxacin]     Diarrhea, GI upset   Zoloft [Sertraline Hcl]     Heart palpitations, PVCs.  Including adverse reaction to zoloft, paxil, lexapro   Zonisamide     Moderate diarrhea   Sulfa Antibiotics Rash   Medications:  Current Outpatient Medications:    ALPRAZolam (XANAX) 0.25 MG tablet, Take 0.25 mg by mouth., Disp: , Rfl:    Insulin Pen Needle (BD PEN NEEDLE NANO U/F) 32G X 4 MM MISC, use 1 pen needle with insulin at bedtime, Disp: 100 each, Rfl: 1   Liraglutide -Weight Management (SAXENDA) 18 MG/3ML SOPN, Inject 3 mg into the skin daily., Disp: 15 mL, Rfl: 2   Multiple Vitamin (MULTIVITAMIN)  tablet, Take 1 tablet by mouth daily., Disp: , Rfl:    nebivolol (BYSTOLIC) 5 MG tablet, Take 1 tablet (5 mg total) by mouth daily., Disp: 90 tablet, Rfl: 3   omeprazole (PRILOSEC) 40 MG capsule, Take 1 capsule (40 mg total) by mouth daily., Disp: 90 capsule, Rfl: 1   primidone (MYSOLINE) 50 MG tablet, Take 1 tablet (50 mg total) by mouth at bedtime., Disp: 90 tablet, Rfl: 3   Topiramate ER (TROKENDI XR) 100 MG CP24, Take 1 capsule by mouth nightly, Disp: 90 capsule, Rfl: 3   tretinoin (RETIN-A) 0.1 % cream, Apply 1 application topically as directed., Disp: 45 g, Rfl: 1  Observations/Objective: Patient is well-developed, well-nourished in no acute distress.  Resting comfortably  at home.  Head is normocephalic, atraumatic.  No labored breathing.  Speech is clear and coherent with logical content.  Patient is alert and oriented at baseline.   Assessment and Plan:  1. Pharyngitis, unspecified etiology  - amoxicillin (AMOXIL) 500 MG tablet; Take 1 tablet (500 mg total) by mouth 2 (two) times daily for 10 days.  Dispense: 20 tablet; Refill: 0  2. Antibiotic-induced yeast infection  - fluconazole (DIFLUCAN) 150 MG tablet; Take 1 tablet (150 mg total) by mouth as directed.  Dispense: 2 tablet; Refill: 0  -S&S with possible strep, will give coverage and Diflucan for Abx yeast infection post Abx.  -salt water gargles -warm fluids -complete medication in full -if not better please be seen in person   Reviewed side effects, risks and benefits of medication.    Patient acknowledged agreement and understanding of the plan.   Past Medical, Surgical, Social History, Allergies, and Medications have been Reviewed.     Follow Up Instructions: I discussed the assessment and treatment plan with the patient. The patient was provided an opportunity to ask questions and all were answered. The patient agreed with the plan and demonstrated an understanding of the instructions.  A copy of  instructions were sent to the patient via MyChart unless otherwise noted below.     The patient was advised to call back or seek an in-person evaluation if the symptoms worsen or if the condition fails to improve as anticipated.  Time:  I spent 10 minutes with the patient via telehealth technology discussing the above problems/concerns.    Freddy Finner, NP

## 2022-06-17 ENCOUNTER — Other Ambulatory Visit: Payer: Self-pay | Admitting: Obstetrics and Gynecology

## 2022-06-17 DIAGNOSIS — Z803 Family history of malignant neoplasm of breast: Secondary | ICD-10-CM

## 2022-06-21 ENCOUNTER — Encounter: Payer: Self-pay | Admitting: Neurology

## 2022-06-21 ENCOUNTER — Telehealth (INDEPENDENT_AMBULATORY_CARE_PROVIDER_SITE_OTHER): Payer: 59 | Admitting: Neurology

## 2022-06-21 ENCOUNTER — Encounter (INDEPENDENT_AMBULATORY_CARE_PROVIDER_SITE_OTHER): Payer: Self-pay

## 2022-06-21 VITALS — Ht 64.5 in | Wt 156.0 lb

## 2022-06-21 DIAGNOSIS — G250 Essential tremor: Secondary | ICD-10-CM

## 2022-06-21 DIAGNOSIS — G43009 Migraine without aura, not intractable, without status migrainosus: Secondary | ICD-10-CM | POA: Diagnosis not present

## 2022-06-21 DIAGNOSIS — Z87898 Personal history of other specified conditions: Secondary | ICD-10-CM

## 2022-06-21 NOTE — Patient Instructions (Signed)
Good to hear you overall doing better. Wishing you all the best for the clinical trial for ET. Follow-up as needed, call for any changes.

## 2022-06-21 NOTE — Progress Notes (Signed)
Virtual Visit via Video Note The purpose of this virtual visit is to provide medical care while limiting exposure to the novel coronavirus.    Consent was obtained for video visit:  Yes.   Answered questions that patient had about telehealth interaction:  Yes.    Pt location: Home Physician Location: office Name of referring provider:  Jac Canavan, PA-C I connected with Oda Cogan at patients initiation/request on 06/21/2022 at  2:30 PM EST by video enabled telemedicine application and verified that I am speaking with the correct person using two identifiers. Pt MRN:  [id number] Pt DOB:  12/07/1970 Video Participants:  Oda Cogan   History of Present Illness:  The patient had a virtual video visit on 06/21/2022. She was last seen in the neurology clinic 7 months ago for seizures, essential tremor, and headaches. Since her last visit, she reports stopping the Trokendi XR and Primidone last month and doing much better. She is feeling great. The only thing going on are the tremors, worse than before. She has signed up to join a clinical trial for Botox for ET in [place]. She has had a couple of headaches with good response to Ibuprofen and Tylenol. She denies any seizures or seizure-like symptoms, no staring/unresponsive episodes. She has not had any seizures since 2011. No vasovagal syncope since 2020. She states she looked back at her prior notes and saw the notes from [facility] said she was incontinent, but she was never incontinent. She also does not recall being told she had an abnormal EEG in the past. She states she has been doing great off the medications, her co-workers have noticed her participation in meeting and leading meetings have improved, she is not word-searching like before. She has some dizziness related to low BP. She is sleeping better now, feeling rested on awakening.    History on Initial Assessment 09/23/2020: This is a 51 year old right-handed woman  with a history of hypotension, gastric bypass, essential tremor, and seizures, presenting to establish care.  1. Seizures. The first seizure occurred at age 51, it was unwitnessed with no prior warning symptoms, she woke up on the bathroom floor with drool on her arm. She denies any tongue bite or incontinence (prior neurology notes indicate there was tongue bite and incontinence with her first GTC in July 2011). She was started on Levetiracetam then switched to Topiramate in 2018 due to side effects of feeling "zombiefied." Prior to the GTC, she was having recurrent syncopal episodes where she would lose consciousness for 15 seconds, attributed to hypotension. They would usually occur when taking a shower or getting up in the morning. She has had 4 episodes, last occurred 1.5 years ago while taking a hot shower, her hearing went and she had tunnel vision, she called for her husband and he lowered her to the floor. As soon as she went out, she woke up, no post-event confusion. She denies any staring/unresponsive episodes, olfactory/gustatory hallucinations, myoclonic jerks. She woke up last Saturday with numbness in her left 2 fingers which took several hours to feel normal. A maternal aunt and paternal cousin have seizures. Otherwise she had a normal birth and early development, no history of febrile convulsions, CNS infections, neurosurgical procedures, significant head injuries.   2. She has benign essential tremor, with family history of tremors in at least 2 maternal cousins. She has had tremors her whole life, more noticeable on the right hand. She notices it when carrying something, it occasionally affects handwriting.  It does not affect eating/drinking or affect work. She was started on Primidone in 2016, taking 100mg  daily with good response. She feels it has affected her libido for the past 3-4 years.  3. Headaches. She has had headaches for several years with pain in the back of her head. No  associated nausea/vomiting, photo/phonophobia. Headaches occur at least once a week, she takes Ibuprofen or Tylenol. She notices more pain when she wakes up in the morning, back of head "is just awful," with 5-6 over 10 pain, going down to a 1-2 with increase in Topiramate to 50mg  in AM, 100mg  in PM. She was having neck pain, she saw a chiropractor which has helped some with headaches, neck pain somewhat better. She has stretching exercises.   4. Memory loss. She is concerned about dementia, she was a major in college but now would play dominoes and have a hard time adding up scores. She has word-finding difficulties. She denies getting lost driving or missing medications. Bills are on autopay. There is a history of dementia on her father's side.    Prior AEDs: Levetiracetam Laboratory Data:  EEGs: Done at [facility] in [place] - normal wake and sleep EEG in 2019, 2017. Per notes, "a previous EEG (not available for review) was abnormal"  MRI: MRI without contrast done 2011 in [place], 2018 reported as normal.  MRI brain 06/2020: No acute changes. I personally reviewed images, there is minimal scattered T2/FLAIR hyperintensities within the frontal predominant white matter. Hippocampi are symmetric and within normal limits.    Current Outpatient Medications on File Prior to Visit  Medication Sig Dispense Refill   fluconazole (DIFLUCAN) 150 MG tablet Take 1 tablet (150 mg total) by mouth as directed. 2 tablet 0   Insulin Pen Needle (BD PEN NEEDLE NANO U/F) 32G X 4 MM MISC use 1 pen needle with insulin at bedtime 100 each 1   Liraglutide -Weight Management (SAXENDA) 18 MG/3ML SOPN Inject 3 mg into the skin daily. 15 mL 2   Multiple Vitamin (MULTIVITAMIN) tablet Take 1 tablet by mouth daily.     nebivolol (BYSTOLIC) 5 MG tablet Take 1 tablet (5 mg total) by mouth daily. 90 tablet 3   omeprazole (PRILOSEC) 40 MG capsule Take 1 capsule (40 mg total) by mouth daily. 90 capsule 1    tretinoin (RETIN-A) 0.1 % cream Apply 1 application topically as directed. 45 g 1   ALPRAZolam (XANAX) 0.25 MG tablet Take 0.25 mg by mouth. (Patient not taking: Reported on 06/21/2022)     No current facility-administered medications on file prior to visit.     Observations/Objective:   Vitals:   06/21/22 1334  Weight: 156 lb (70.8 kg)  Height: 5' 4.5" (1.638 m)   GEN:  The patient appears stated age and is in NAD.  Neurological examination: Patient is awake, alert. No aphasia or dysarthria. Intact fluency and comprehension. Cranial nerves: Extraocular movements intact . No facial asymmetry. Motor: moves all extremities symmetrically, at least anti-gravity x 4.    Assessment and Plan:   This is a pleasant 51 yo RH with a history of hypotension, gastric bypass, essential tremor, seizures, and headaches. She had self-discontinued Trokendi XR and Primidone and notes an improvement in overall well-being. She is not having any significant headaches off Trokendi, and remains seizure-free since 2011, no episodes of vasovagal syncope since 2020. The tremors have worsened, she is hoping to be a candidate to join a clinical trial for Botox for ET. She will  follow-up prn and knows to call for any changes.    Follow Up Instructions:   -I discussed the assessment and treatment plan with the patient. The patient was provided an opportunity to ask questions and all were answered. The patient agreed with the plan and demonstrated an understanding of the instructions.   The patient was advised to call back or seek an in-person evaluation if the symptoms worsen or if the condition fails to improve as anticipated.     Van Clines, MD  CC: Crosby Oyster, PA-C

## 2022-07-06 DIAGNOSIS — Z01419 Encounter for gynecological examination (general) (routine) without abnormal findings: Secondary | ICD-10-CM | POA: Diagnosis not present

## 2022-07-06 DIAGNOSIS — Z6826 Body mass index (BMI) 26.0-26.9, adult: Secondary | ICD-10-CM | POA: Diagnosis not present

## 2022-07-08 ENCOUNTER — Ambulatory Visit
Admission: RE | Admit: 2022-07-08 | Discharge: 2022-07-08 | Disposition: A | Discharge status: Home / Self Care | Payer: Commercial Managed Care - PPO | Source: Ambulatory Visit | Attending: Obstetrics and Gynecology | Admitting: Obstetrics and Gynecology

## 2022-07-08 ENCOUNTER — Other Ambulatory Visit: Payer: Self-pay | Admitting: Medical

## 2022-07-08 ENCOUNTER — Other Ambulatory Visit: Payer: Self-pay

## 2022-07-08 ENCOUNTER — Other Ambulatory Visit (HOSPITAL_BASED_OUTPATIENT_CLINIC_OR_DEPARTMENT_OTHER): Payer: Self-pay

## 2022-07-08 ENCOUNTER — Other Ambulatory Visit (HOSPITAL_COMMUNITY): Payer: Self-pay

## 2022-07-08 ENCOUNTER — Encounter: Payer: Self-pay | Admitting: Internal Medicine

## 2022-07-08 DIAGNOSIS — N6489 Other specified disorders of breast: Secondary | ICD-10-CM | POA: Diagnosis not present

## 2022-07-08 DIAGNOSIS — Z803 Family history of malignant neoplasm of breast: Secondary | ICD-10-CM

## 2022-07-08 MED ORDER — GADOPICLENOL 0.5 MMOL/ML IV SOLN
7.0000 mL | Freq: Once | INTRAVENOUS | Status: AC | PRN
  Administered 2022-07-08: 7 mL via INTRAVENOUS

## 2022-07-08 MED ORDER — ALPRAZOLAM 0.25 MG PO TABS
0.2500 mg | ORAL_TABLET | Freq: Every day | ORAL | 0 refills | Status: DC | PRN
  Filled 2022-07-08: qty 30, 30d supply, fill #0

## 2022-07-08 NOTE — Telephone Encounter (Signed)
From: Darletta Moll To: Office of Dorothea Ogle, [place] Sent: 07/08/2022 11:19 AM EST Subject: Medication Renewal Request  Refills have been requested for the following medications:   ALPRAZolam (XANAX) 0.25 MG tablet  Preferred pharmacy: [facility] Delivery method: Pickup Preferred pick-up date and time: 07/11/2022 5:00 PM

## 2022-07-11 ENCOUNTER — Other Ambulatory Visit (HOSPITAL_BASED_OUTPATIENT_CLINIC_OR_DEPARTMENT_OTHER): Payer: Self-pay

## 2022-07-11 ENCOUNTER — Other Ambulatory Visit (HOSPITAL_COMMUNITY): Payer: Self-pay

## 2022-07-11 MED ORDER — ALPRAZOLAM 0.25 MG PO TABS
0.2500 mg | ORAL_TABLET | Freq: Every day | ORAL | 0 refills | Status: DC | PRN
  Filled 2022-07-11: qty 30, 30d supply, fill #0

## 2022-07-13 ENCOUNTER — Other Ambulatory Visit (HOSPITAL_BASED_OUTPATIENT_CLINIC_OR_DEPARTMENT_OTHER): Payer: Self-pay

## 2022-08-08 ENCOUNTER — Other Ambulatory Visit (HOSPITAL_BASED_OUTPATIENT_CLINIC_OR_DEPARTMENT_OTHER): Payer: Self-pay

## 2022-08-08 ENCOUNTER — Other Ambulatory Visit: Payer: Self-pay | Admitting: Medical

## 2022-08-08 MED ORDER — SAXENDA 18 MG/3ML ~~LOC~~ SOPN
3.0000 mg | PEN_INJECTOR | Freq: Every day | SUBCUTANEOUS | 1 refills | Status: DC
  Filled 2022-08-08: qty 15, 30d supply, fill #0
  Filled 2022-08-24 – 2022-09-02 (×4): qty 15, 30d supply, fill #1

## 2022-08-08 MED ORDER — TECHLITE PEN NEEDLES 32G X 4 MM MISC
1.0000 | Freq: Every day | 1 refills | Status: DC
  Filled 2022-08-08 – 2022-08-24 (×2): qty 100, 90d supply, fill #0

## 2022-08-24 ENCOUNTER — Other Ambulatory Visit: Payer: Self-pay

## 2022-08-24 ENCOUNTER — Other Ambulatory Visit (HOSPITAL_BASED_OUTPATIENT_CLINIC_OR_DEPARTMENT_OTHER): Payer: Self-pay

## 2022-08-30 ENCOUNTER — Other Ambulatory Visit (HOSPITAL_BASED_OUTPATIENT_CLINIC_OR_DEPARTMENT_OTHER): Payer: Self-pay

## 2022-09-01 ENCOUNTER — Other Ambulatory Visit (HOSPITAL_BASED_OUTPATIENT_CLINIC_OR_DEPARTMENT_OTHER): Payer: Self-pay

## 2022-09-02 ENCOUNTER — Other Ambulatory Visit (HOSPITAL_BASED_OUTPATIENT_CLINIC_OR_DEPARTMENT_OTHER): Payer: Self-pay

## 2022-09-05 ENCOUNTER — Other Ambulatory Visit: Payer: Self-pay | Admitting: Medical

## 2022-09-05 ENCOUNTER — Other Ambulatory Visit (HOSPITAL_BASED_OUTPATIENT_CLINIC_OR_DEPARTMENT_OTHER): Payer: Self-pay

## 2022-09-06 NOTE — Telephone Encounter (Signed)
This was filled in February with 1 refill

## 2022-09-07 ENCOUNTER — Other Ambulatory Visit (HOSPITAL_BASED_OUTPATIENT_CLINIC_OR_DEPARTMENT_OTHER): Payer: Self-pay

## 2022-09-12 ENCOUNTER — Other Ambulatory Visit: Payer: Self-pay | Admitting: Medical

## 2022-09-13 ENCOUNTER — Other Ambulatory Visit (HOSPITAL_BASED_OUTPATIENT_CLINIC_OR_DEPARTMENT_OTHER): Payer: Self-pay

## 2022-09-14 NOTE — Telephone Encounter (Signed)
Pt. Called and sent mychart message to schedule a f/u on weight loss.

## 2022-09-19 ENCOUNTER — Other Ambulatory Visit (HOSPITAL_BASED_OUTPATIENT_CLINIC_OR_DEPARTMENT_OTHER): Payer: Self-pay

## 2022-09-22 ENCOUNTER — Other Ambulatory Visit (HOSPITAL_BASED_OUTPATIENT_CLINIC_OR_DEPARTMENT_OTHER): Payer: Self-pay

## 2022-09-22 ENCOUNTER — Encounter (HOSPITAL_BASED_OUTPATIENT_CLINIC_OR_DEPARTMENT_OTHER): Payer: Self-pay | Admitting: Pharmacist

## 2022-09-30 ENCOUNTER — Encounter: Payer: Self-pay | Admitting: Medical

## 2022-09-30 ENCOUNTER — Other Ambulatory Visit (HOSPITAL_BASED_OUTPATIENT_CLINIC_OR_DEPARTMENT_OTHER): Payer: Self-pay

## 2022-09-30 ENCOUNTER — Ambulatory Visit: Payer: Commercial Managed Care - PPO | Admitting: Medical

## 2022-09-30 VITALS — BP 120/74 | HR 72 | Ht 65.0 in | Wt 160.0 lb

## 2022-09-30 DIAGNOSIS — K219 Gastro-esophageal reflux disease without esophagitis: Secondary | ICD-10-CM | POA: Diagnosis not present

## 2022-09-30 DIAGNOSIS — Z862 Personal history of diseases of the blood and blood-forming organs and certain disorders involving the immune mechanism: Secondary | ICD-10-CM | POA: Diagnosis not present

## 2022-09-30 DIAGNOSIS — Z136 Encounter for screening for cardiovascular disorders: Secondary | ICD-10-CM | POA: Diagnosis not present

## 2022-09-30 DIAGNOSIS — Z7689 Persons encountering health services in other specified circumstances: Secondary | ICD-10-CM | POA: Insufficient documentation

## 2022-09-30 DIAGNOSIS — E538 Deficiency of other specified B group vitamins: Secondary | ICD-10-CM

## 2022-09-30 DIAGNOSIS — Z9884 Bariatric surgery status: Secondary | ICD-10-CM | POA: Diagnosis not present

## 2022-09-30 DIAGNOSIS — E663 Overweight: Secondary | ICD-10-CM

## 2022-09-30 DIAGNOSIS — E559 Vitamin D deficiency, unspecified: Secondary | ICD-10-CM | POA: Diagnosis not present

## 2022-09-30 MED ORDER — NALTREXONE-BUPROPION HCL ER 8-90 MG PO TB12
2.0000 | ORAL_TABLET | Freq: Two times a day (BID) | ORAL | 0 refills | Status: DC
  Filled 2022-09-30 – 2022-10-10 (×3): qty 120, 30d supply, fill #0

## 2022-09-30 NOTE — Progress Notes (Signed)
Subjective:  Latasha Ramirez is a 52 y.o. female who presents for Chief Complaint  Patient presents with   Follow-up    Weightloss follow up. Wanting to know where to go from here since [facility] no longer covering injectable.      Here for weight loss, weight management follow-up.  She has been using Saxenda injectable with some benefit but now insurance no longer covers weight loss injectable medication.  Exercise - goes to [employer], walks regularly outside, gets exercise 4-5 days per week.   Eating habits - more protein, using more farro grain, fair life chocolate shakes.  Does a lot of fruits and vegetables.  Has been using Saxenda regularly but insurance no longer going to cover injectables such as GLP1 medicaiton.   Insurance will cover contrave and qysmia.   She notes she has not tried those before, but ha used phentermine prior.  Wants to recheck some labs given hx/o vitamin deficiency and anemia.  Wants homocysteine level.  She has a cousin with recent valve issues and heart concerns.  No other aggravating or relieving factors.    No other c/o.  Past Medical History:  Diagnosis Date   Anxiety    GERD (gastroesophageal reflux disease)    History of bleeding ulcers    Hypotension    Seizures    last seizure 2010.  last EEG 2015   Current Outpatient Medications on File Prior to Visit  Medication Sig Dispense Refill   Insulin Pen Needle (TECHLITE PEN NEEDLES) 32G X 4 MM MISC use 1 pen needle with insulin at bedtime 100 each 1   Liraglutide -Weight Management (SAXENDA) 18 MG/3ML SOPN Inject 3 mg into the skin daily. 15 mL 1   Multiple Vitamin (MULTIVITAMIN) tablet Take 1 tablet by mouth daily.     nebivolol (BYSTOLIC) 5 MG tablet Take 1 tablet (5 mg total) by mouth daily. 90 tablet 3   omeprazole (PRILOSEC) 40 MG capsule Take 1 capsule (40 mg total) by mouth daily. 90 capsule 1   No current facility-administered medications on file prior to visit.    The following portions of  the patient's history were reviewed and updated as appropriate: allergies, current medications, past family history, past medical history, past social history, past surgical history and problem list.  ROS Otherwise as in subjective above    Objective: BP 120/74   Pulse 72   Ht 5\' 5"  (1.651 m)   Wt 160 lb (72.6 kg)   LMP 03/28/2022 (Exact Date)   BMI 26.63 kg/m   General appearance: alert, no distress, well developed, well nourished Neck: supple, no lymphadenopathy, no thyromegaly, no masses Heart: RRR, normal S1, S2, no murmurs Lungs: CTA bilaterally, no wheezes, rhonchi, or rales Pulses: 2+ radial pulses, 2+ pedal pulses, normal cap refill Ext: no edema    Assessment: Encounter Diagnoses  Name Primary?   Encounter for weight management Yes   Gastroesophageal reflux disease, unspecified whether esophagitis present    Overweight    B12 deficiency    H/O gastric bypass    Screening for heart disease    History of anemia    Vitamin D deficiency      Plan: Discussed concerns.  Although she has done well on Saxenda, insurance no longer paying for this.  Continue with healthy eating habits, continue with weightbearing and aerobic exercise.  We discussed other options such as Qsymia and Contrave.  She does want to continue medication to help with weight management.  She has not  done well with totipalmate in the past we want to avoid Qsymia for now.  Begin trial of Contrave.  Discussed risk and benefits of medication.  Looking back in the chart record her weight was 177 pounds last July 2023  I wrote a letter of necessity for gym membership  Labs as below at her request today   [name] was seen today for follow-up.  Diagnoses and all orders for this visit:  Encounter for weight management  Gastroesophageal reflux disease, unspecified whether esophagitis present  Overweight  B12 deficiency -     Vitamin B12  H/O gastric bypass -     Iron, TIBC and Ferritin  Panel -     Vitamin B12 -     Homocysteine -     VITAMIN D 25 Hydroxy (Vit-D Deficiency, Fractures)  Screening for heart disease -     Homocysteine  History of anemia -     Iron, TIBC and Ferritin Panel  Vitamin D deficiency -     VITAMIN D 25 Hydroxy (Vit-D Deficiency, Fractures)  Other orders -     Naltrexone-buPROPion HCl ER 8-90 MG TB12; Take 2 tablets by mouth 2 (two) times daily with a meal.   Follow up: 6-8 weeks

## 2022-10-01 ENCOUNTER — Other Ambulatory Visit (HOSPITAL_BASED_OUTPATIENT_CLINIC_OR_DEPARTMENT_OTHER): Payer: Self-pay

## 2022-10-01 LAB — VITAMIN B12: Vitamin B-12: 726 pg/mL (ref 232–1245)

## 2022-10-01 LAB — IRON,TIBC AND FERRITIN PANEL
Ferritin: 14 ng/mL — ABNORMAL LOW (ref 15–150)
Iron Saturation: 15 % (ref 15–55)
Iron: 59 ug/dL (ref 27–159)
Total Iron Binding Capacity: 395 ug/dL (ref 250–450)
UIBC: 336 ug/dL (ref 131–425)

## 2022-10-01 LAB — VITAMIN D 25 HYDROXY (VIT D DEFICIENCY, FRACTURES): Vit D, 25-Hydroxy: 38.6 ng/mL (ref 30.0–100.0)

## 2022-10-01 LAB — HOMOCYSTEINE: Homocysteine: 10.9 umol/L (ref 0.0–14.5)

## 2022-10-03 ENCOUNTER — Other Ambulatory Visit (HOSPITAL_BASED_OUTPATIENT_CLINIC_OR_DEPARTMENT_OTHER): Payer: Self-pay

## 2022-10-03 NOTE — Progress Notes (Signed)
Results sent through MyChart

## 2022-10-05 ENCOUNTER — Other Ambulatory Visit: Payer: Self-pay

## 2022-10-05 ENCOUNTER — Other Ambulatory Visit (HOSPITAL_BASED_OUTPATIENT_CLINIC_OR_DEPARTMENT_OTHER): Payer: Self-pay

## 2022-10-05 ENCOUNTER — Other Ambulatory Visit: Payer: Self-pay | Admitting: Medical

## 2022-10-05 MED ORDER — OMEPRAZOLE 40 MG PO CPDR
40.0000 mg | DELAYED_RELEASE_CAPSULE | Freq: Every day | ORAL | 1 refills | Status: DC
  Filled 2022-10-05: qty 90, 90d supply, fill #0
  Filled 2023-01-18: qty 90, 90d supply, fill #1

## 2022-10-06 ENCOUNTER — Other Ambulatory Visit: Payer: Self-pay

## 2022-10-06 ENCOUNTER — Other Ambulatory Visit (HOSPITAL_BASED_OUTPATIENT_CLINIC_OR_DEPARTMENT_OTHER): Payer: Self-pay

## 2022-10-08 ENCOUNTER — Other Ambulatory Visit (HOSPITAL_BASED_OUTPATIENT_CLINIC_OR_DEPARTMENT_OTHER): Payer: Self-pay

## 2022-10-10 ENCOUNTER — Encounter (HOSPITAL_BASED_OUTPATIENT_CLINIC_OR_DEPARTMENT_OTHER): Payer: Self-pay | Admitting: Pharmacist

## 2022-10-10 ENCOUNTER — Other Ambulatory Visit (HOSPITAL_BASED_OUTPATIENT_CLINIC_OR_DEPARTMENT_OTHER): Payer: Self-pay

## 2022-10-10 NOTE — Telephone Encounter (Signed)
Patient would like a status update of her PA please

## 2022-10-13 ENCOUNTER — Telehealth: Payer: Self-pay | Admitting: Family Medicine

## 2022-10-13 NOTE — Telephone Encounter (Signed)
PA for contrave sent in cover my meds

## 2022-10-17 ENCOUNTER — Telehealth: Payer: Self-pay

## 2022-10-17 NOTE — Telephone Encounter (Signed)
PA for Contrave denied due to the pt not having a bmi of 27 kg or greater.

## 2022-10-18 ENCOUNTER — Other Ambulatory Visit (HOSPITAL_BASED_OUTPATIENT_CLINIC_OR_DEPARTMENT_OTHER): Payer: Self-pay

## 2022-10-18 ENCOUNTER — Other Ambulatory Visit: Payer: Self-pay | Admitting: Medical

## 2022-10-18 MED ORDER — PHENTERMINE HCL 37.5 MG PO TABS
37.5000 mg | ORAL_TABLET | Freq: Every day | ORAL | 0 refills | Status: DC
  Filled 2022-10-18: qty 30, 30d supply, fill #0

## 2022-10-18 NOTE — Telephone Encounter (Signed)
Pt advised she is okay with generic phentermine short term since Qsymia will likely require a PA.

## 2022-10-19 ENCOUNTER — Other Ambulatory Visit (HOSPITAL_BASED_OUTPATIENT_CLINIC_OR_DEPARTMENT_OTHER): Payer: Self-pay

## 2022-10-21 NOTE — Telephone Encounter (Signed)
Looks like Lafonda Mosses did P.A. 10/13/22 & it will not let me access it,  will forward to Lafonda Mosses to follow up on this

## 2022-10-25 ENCOUNTER — Telehealth: Payer: Self-pay | Admitting: Family Medicine

## 2022-10-25 NOTE — Telephone Encounter (Signed)
Contrave denied.  BMI over 27 needed pt is at 26.63 and at least one weight related comorbidity such as hypertension, HPB, type 2 diabetes or hyperlipidemia.  Latasha Ramirez please advise next steps.

## 2022-10-26 NOTE — Telephone Encounter (Signed)
Per Patient- Are they taking into consideration that I've been taking Saxenda and I'm switching because Bernie Covey is no longer covered? Also she has PCOS so this causes her weight gain   Please advise patient

## 2022-10-26 NOTE — Telephone Encounter (Signed)
Latasha Ramirez please advise next steps for patient.

## 2022-10-27 NOTE — Telephone Encounter (Signed)
Pt is still using phetermine. Pt would like a refill on this

## 2022-10-28 ENCOUNTER — Other Ambulatory Visit: Payer: Self-pay | Admitting: Medical

## 2022-11-15 ENCOUNTER — Other Ambulatory Visit: Payer: Self-pay | Admitting: Medical

## 2022-11-15 ENCOUNTER — Other Ambulatory Visit (HOSPITAL_BASED_OUTPATIENT_CLINIC_OR_DEPARTMENT_OTHER): Payer: Self-pay

## 2022-11-15 MED ORDER — PHENTERMINE HCL 37.5 MG PO TABS
37.5000 mg | ORAL_TABLET | Freq: Every day | ORAL | 0 refills | Status: DC
  Filled 2022-11-15 – 2022-11-23 (×2): qty 30, 30d supply, fill #0

## 2022-11-23 ENCOUNTER — Other Ambulatory Visit (HOSPITAL_BASED_OUTPATIENT_CLINIC_OR_DEPARTMENT_OTHER): Payer: Self-pay

## 2022-11-24 ENCOUNTER — Other Ambulatory Visit: Payer: Self-pay

## 2022-11-25 ENCOUNTER — Ambulatory Visit: Payer: Commercial Managed Care - PPO | Admitting: Nurse Practitioner

## 2022-11-25 ENCOUNTER — Ambulatory Visit: Payer: Commercial Managed Care - PPO | Admitting: Medical

## 2022-11-25 ENCOUNTER — Encounter: Payer: Self-pay | Admitting: Medical

## 2022-11-25 ENCOUNTER — Other Ambulatory Visit (HOSPITAL_BASED_OUTPATIENT_CLINIC_OR_DEPARTMENT_OTHER): Payer: Self-pay

## 2022-11-25 VITALS — BP 108/72 | HR 80 | Temp 98.6°F | Resp 14 | Ht 65.0 in | Wt 167.2 lb

## 2022-11-25 DIAGNOSIS — Z6827 Body mass index (BMI) 27.0-27.9, adult: Secondary | ICD-10-CM | POA: Diagnosis not present

## 2022-11-25 DIAGNOSIS — Z9884 Bariatric surgery status: Secondary | ICD-10-CM

## 2022-11-25 DIAGNOSIS — E611 Iron deficiency: Secondary | ICD-10-CM

## 2022-11-25 DIAGNOSIS — E663 Overweight: Secondary | ICD-10-CM | POA: Diagnosis not present

## 2022-11-25 DIAGNOSIS — Z79899 Other long term (current) drug therapy: Secondary | ICD-10-CM | POA: Diagnosis not present

## 2022-11-25 DIAGNOSIS — E569 Vitamin deficiency, unspecified: Secondary | ICD-10-CM | POA: Diagnosis not present

## 2022-11-25 MED ORDER — PHENTERMINE HCL 37.5 MG PO TABS
37.5000 mg | ORAL_TABLET | Freq: Every day | ORAL | 2 refills | Status: DC
  Filled 2022-11-25: qty 30, 30d supply, fill #0
  Filled 2022-12-20: qty 30, 30d supply, fill #1
  Filled 2023-01-18: qty 30, 30d supply, fill #2

## 2022-11-25 NOTE — Progress Notes (Signed)
Subjective:  Latasha Ramirez is a 52 y.o. female who presents for Chief Complaint  Patient presents with   Weight Check    Follow up on weight management.      Here for follow up on weight loss efforts  Current exercise: Pilates regularly, walking and weights regularly, 4 days per week.  Dietary efforts: Health diet, no recent changes  Medication: Phentermine currently 1/2 tablet daily, but was doing better on Saxenda.    When she was on saxenda her periods were better controlled she thinks.  Not currently on birth control or hormones.  Needs letter of medical necessity to use for [facility] monies for her multivitamin and iron given her deficiencies.   No other aggravating or relieving factors.    No other c/o.  Past Medical History:  Diagnosis Date   Anxiety    GERD (gastroesophageal reflux disease)    History of bleeding ulcers    Hypotension    Seizures (HCC)    last seizure 2010.  last EEG 2015    Current Outpatient Medications on File Prior to Visit  Medication Sig Dispense Refill   Multiple Vitamin (MULTIVITAMIN) tablet Take 1 tablet by mouth daily.     nebivolol (BYSTOLIC) 5 MG tablet Take 1 tablet (5 mg total) by mouth daily. 90 tablet 3   omeprazole (PRILOSEC) 40 MG capsule Take 1 capsule (40 mg total) by mouth daily. 90 capsule 1   Insulin Pen Needle (TECHLITE PEN NEEDLES) 32G X 4 MM MISC use 1 pen needle with insulin at bedtime 100 each 1   No current facility-administered medications on file prior to visit.    The following portions of the patient's history were reviewed and updated as appropriate: allergies, current medications, past family history, past medical history, past social history, past surgical history and problem list.  ROS Otherwise as in subjective above    Objective: BP 108/72   Pulse 80   Temp 98.6 F (37 C) (Oral)   Resp 14   Ht 5\' 5"  (1.651 m)   Wt 167 lb 3.2 oz (75.8 kg)   LMP 10/25/2022   SpO2 99%   BMI 27.82 kg/m   Wt Readings  from Last 3 Encounters:  11/25/22 167 lb 3.2 oz (75.8 kg)  09/30/22 160 lb (72.6 kg)  06/21/22 156 lb (70.8 kg)   Gen: wd, wn, nad Psych: pleasant , good eye contact, answers question appropriately    Assessment: Encounter Diagnoses  Name Primary?   Medication management Yes   Overweight    BMI 27.0-27.9,adult    Vitamin deficiency    Iron deficiency    H/O gastric bypass      Plan: Discussed risks/benefits of phentermine.   She will continue phentermine but will use sometimes 1 tablet daily alternating with 1/2 tablet daily.   Continue to monitor for side effects and monitor BPs at home and here  Counseled on diet, exercise, limiting salt and lowering animal product consumption, eating more plant based   Letter of medical necessity writing regarding vitamin and iron deficiency so she can use her HSA savings for these medications  Latasha Ramirez was seen today for weight check.  Diagnoses and all orders for this visit:  Medication management  Overweight  BMI 27.0-27.9,adult  Vitamin deficiency  Iron deficiency  H/O gastric bypass  Other orders -     phentermine (ADIPEX-P) 37.5 MG tablet; Take 1 tablet (37.5 mg total) by mouth daily before breakfast.    Follow up: 54mo

## 2022-12-20 ENCOUNTER — Other Ambulatory Visit: Payer: Self-pay

## 2022-12-21 ENCOUNTER — Other Ambulatory Visit (HOSPITAL_BASED_OUTPATIENT_CLINIC_OR_DEPARTMENT_OTHER): Payer: Self-pay

## 2022-12-23 ENCOUNTER — Other Ambulatory Visit (HOSPITAL_BASED_OUTPATIENT_CLINIC_OR_DEPARTMENT_OTHER): Payer: Self-pay

## 2023-01-24 ENCOUNTER — Other Ambulatory Visit (HOSPITAL_BASED_OUTPATIENT_CLINIC_OR_DEPARTMENT_OTHER): Payer: Self-pay

## 2023-02-08 ENCOUNTER — Ambulatory Visit (INDEPENDENT_AMBULATORY_CARE_PROVIDER_SITE_OTHER): Payer: Commercial Managed Care - PPO | Admitting: Medical

## 2023-02-08 ENCOUNTER — Encounter: Payer: Self-pay | Admitting: Medical

## 2023-02-08 VITALS — BP 120/72 | HR 71 | Ht 65.0 in | Wt 169.6 lb

## 2023-02-08 DIAGNOSIS — Z8249 Family history of ischemic heart disease and other diseases of the circulatory system: Secondary | ICD-10-CM

## 2023-02-08 DIAGNOSIS — Z9884 Bariatric surgery status: Secondary | ICD-10-CM

## 2023-02-08 DIAGNOSIS — G40909 Epilepsy, unspecified, not intractable, without status epilepticus: Secondary | ICD-10-CM

## 2023-02-08 DIAGNOSIS — Z7185 Encounter for immunization safety counseling: Secondary | ICD-10-CM

## 2023-02-08 DIAGNOSIS — Z803 Family history of malignant neoplasm of breast: Secondary | ICD-10-CM

## 2023-02-08 DIAGNOSIS — K219 Gastro-esophageal reflux disease without esophagitis: Secondary | ICD-10-CM

## 2023-02-08 DIAGNOSIS — Z0000 Encounter for general adult medical examination without abnormal findings: Secondary | ICD-10-CM | POA: Diagnosis not present

## 2023-02-08 DIAGNOSIS — Z131 Encounter for screening for diabetes mellitus: Secondary | ICD-10-CM

## 2023-02-08 DIAGNOSIS — E559 Vitamin D deficiency, unspecified: Secondary | ICD-10-CM | POA: Diagnosis not present

## 2023-02-08 DIAGNOSIS — I75023 Atheroembolism of bilateral lower extremities: Secondary | ICD-10-CM

## 2023-02-08 DIAGNOSIS — E538 Deficiency of other specified B group vitamins: Secondary | ICD-10-CM

## 2023-02-08 DIAGNOSIS — Z8041 Family history of malignant neoplasm of ovary: Secondary | ICD-10-CM

## 2023-02-08 DIAGNOSIS — I493 Ventricular premature depolarization: Secondary | ICD-10-CM

## 2023-02-08 DIAGNOSIS — R002 Palpitations: Secondary | ICD-10-CM

## 2023-02-08 NOTE — Progress Notes (Signed)
Subjective:   HPI  Latasha Ramirez is a 52 y.o. female who presents for Chief Complaint  Patient presents with   Annual Exam    Fasting cpe, no concerns    Patient Care Team: Rashad Auld, Cleda Mccreedy as PCP - General (Family Medicine) Van Clines, MD as Consulting Physician (Neurology) Sees dentist Sees eye doctor, [place] Dr. Truett Mainland, cardiology Dr. Genia Harold, [facility]   Concerns: She wished she could get saxenda covered. While on it she was seeing good improvements in weight loss and it seemed to help her periods.  Last menstrual periods 03/2022, but recently got a period.    Sees gyn  She has history of negative BRCA gene testing  Sees neurology for seizure disorder  On bystolic for palpations.  Doesn't do well on generic for bystolic  She has purple toes, but no pain.  Sometimes right middle toe goes numb.  No similar discoloration in hands.      Past Medical History:  Diagnosis Date   Anxiety    GERD (gastroesophageal reflux disease)    History of bleeding ulcers    Hypotension    Seizures (HCC)    last seizure 2010.  last EEG 2015    Family History  Problem Relation Age of Onset   Ovarian cancer Mother    Cancer Mother 42       ovarian   Heart disease Father 83       died with MI   Diabetes Father    Diabetes Maternal Grandmother    Suicidality Maternal Grandfather    Diabetes Paternal Grandmother    Skin cancer Paternal Grandfather    Cancer Paternal Aunt        breast   Cancer Paternal Uncle        bladder   Cancer Paternal Aunt        breast     Current Outpatient Medications:    Multiple Vitamin (MULTIVITAMIN) tablet, Take 1 tablet by mouth daily., Disp: , Rfl:    nebivolol (BYSTOLIC) 5 MG tablet, Take 1 tablet (5 mg total) by mouth daily., Disp: 90 tablet, Rfl: 3   omeprazole (PRILOSEC) 40 MG capsule, Take 1 capsule (40 mg total) by mouth daily., Disp: 90 capsule, Rfl: 1   phentermine (ADIPEX-P) 37.5 MG  tablet, Take 1 tablet (37.5 mg total) by mouth daily before breakfast., Disp: 30 tablet, Rfl: 2  Allergies  Allergen Reactions   Other Rash    Foam tape cause blisters / all adhesives cause contact rash    Adhesive [Tape]    Flexeril [Cyclobenzaprine]     Palpitations on Flexeril and Skelaxin    Levaquin [Levofloxacin]     Diarrhea, GI upset   Topiramate     Felt foggy , not healthy feeling   Zoloft [Sertraline Hcl]     Heart palpitations, PVCs.  Including adverse reaction to zoloft, paxil, lexapro   Zonisamide     Moderate diarrhea   Sulfa Antibiotics Rash   Past Surgical History:  Procedure Laterality Date   CESAREAN SECTION     CHOLECYSTECTOMY     COLONOSCOPY  10/21/2019   Dr. Genia Harold, normal colon   ESOPHAGOGASTRODUODENOSCOPY     GASTRIC BYPASS      Reviewed their medical, surgical, family, social, medication, and allergy history and updated chart as appropriate.  Review of Systems  Constitutional:  Negative for chills, fever, malaise/fatigue and weight loss.  HENT:  Negative for congestion, ear pain, hearing  loss, sore throat and tinnitus.   Eyes:  Negative for blurred vision, pain and redness.  Respiratory:  Negative for cough, hemoptysis and shortness of breath.   Cardiovascular:  Negative for chest pain, palpitations, orthopnea, claudication and leg swelling.  Gastrointestinal:  Negative for abdominal pain, blood in stool, constipation, diarrhea, nausea and vomiting.  Genitourinary:  Negative for dysuria, flank pain, frequency, hematuria and urgency.  Musculoskeletal:  Negative for falls, joint pain and myalgias.  Skin:  Negative for itching and rash.  Neurological:  Negative for dizziness, tingling, speech change, weakness and headaches.  Endo/Heme/Allergies:  Negative for polydipsia. Does not bruise/bleed easily.  Psychiatric/Behavioral:  Negative for depression and memory loss. The patient is not nervous/anxious and does not have insomnia.         Objective:  BP 120/72   Pulse 71   Ht 5\' 5"  (1.651 m)   Wt 169 lb 9.6 oz (76.9 kg)   LMP 02/07/2023   BMI 28.22 kg/m   Wt Readings from Last 3 Encounters:  02/08/23 169 lb 9.6 oz (76.9 kg)  11/25/22 167 lb 3.2 oz (75.8 kg)  09/30/22 160 lb (72.6 kg)    General appearance: alert, no distress, WD/WN, Caucasian female Skin: No worrisome lesions, scattered macules HEENT: normocephalic, conjunctiva/corneas normal, sclerae anicteric, PERRLA, EOMi, nares patent, no discharge or erythema, pharynx normal Oral cavity: MMM, tongue normal, teeth normal Neck: supple, no lymphadenopathy, no thyromegaly, no masses, normal ROM, no bruits Chest: non tender, normal shape and expansion Heart: RRR, normal S1, S2, no murmurs Lungs: CTA bilaterally, no wheezes, rhonchi, or rales Abdomen: +bs, faint surgical port scars, soft, non tender, non distended, no masses, no hepatomegaly, no splenomegaly, no bruits Back: non tender, normal ROM, no scoliosis Musculoskeletal: upper extremities non tender, no obvious deformity, normal ROM throughout, lower extremities non tender, no obvious deformity, normal ROM throughout Extremities: purple appearing distal feet and toes bilat, otherwise no edema, no clubbing Pulses: 2+ symmetric, upper and lower extremities, normal cap refill Neurological: alert, oriented x 3, CN2-12 intact, strength normal upper extremities and lower extremities, sensation normal throughout, DTRs 2+ throughout, no cerebellar signs, gait normal Psychiatric: normal affect, behavior normal, pleasant  Breast/gyn/rectal - deferred to gynecology     Assessment and Plan :   Encounter Diagnoses  Name Primary?   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    Topics today may have included healthy lifestyle, diet, exercise, preventative care, vaccinations, sick and well care, proper use of emergency dept and after hours care, as well as other concerns.     Recommendations: Continue to return yearly for your annual wellness and preventative care visits.  This gives [place] a chance to discuss healthy lifestyle, exercise, vaccinations, review your chart record, and perform screenings where appropriate.  I recommend you see your eye doctor yearly for routine vision care.  I recommend you see your dentist yearly for routine  dental care including hygiene visits twice yearly.  See your gynecologist yearly for routine gynecological care.    Vaccination recommendations were reviewed  I recommend a yearly flu shot  Return soon for Shingrix #2, but she declines today   Screening for cancer: Breast cancer screening: You should perform a self breast exam monthly.   Recent breast imaging per gyn 2024  Colon cancer screening:  I reviewed your colonoscopy on file that is up to date from 2021  Cervical cancer screening: We reviewed recommendations for pap smear screening.  Skin cancer screening: Check your skin regularly for new changes, growing lesions, or other lesions of concern Come in for evaluation if you have skin lesions of concern.  Lung cancer screening: If you have a greater than 30 pack year history of tobacco use, then you qualify for lung cancer screening with a chest CT scan  We currently don't have screenings for other cancers besides breast, cervical, colon, and lung cancers.  If you have a strong family history of cancer or have other cancer screening concerns, please let me know.     Bone health: Get at least 150 minutes of aerobic exercise weekly Get weight bearing exercise at least once weekly   Heart health: Get at least 150 minutes of aerobic exercise weekly Limit alcohol It is important to maintain a healthy blood pressure and healthy cholesterol numbers    Separate significant issues discussed: Seizure disorder- managed by neurology, compliant with medication  Essential tremor- on medicine per neurology  PVCs, sees cardiology no recent complaint  Obesity -continue with lifestyle changes to lose weight.  Purplish toes - screen with ABI referral   Disaya was seen today for annual exam.  Diagnoses and all orders for this visit:  Encounter for health maintenance examination in adult -     CBC -     Comprehensive metabolic panel -     Lipid panel -     TSH -     Magnesium -     Hemoglobin A1c -     VITAMIN D 25 Hydroxy (Vit-D Deficiency, Fractures)  Vaccine counseling  Family history of premature CAD  Vitamin D deficiency -     Magnesium -     VITAMIN D 25 Hydroxy (Vit-D Deficiency, Fractures)  Symptomatic PVCs  Seizure disorder (HCC)  B12 deficiency  Family history of breast cancer  Family history of ovarian cancer  H/O gastric bypass  Palpitations -     TSH -     Magnesium  Gastroesophageal reflux disease, unspecified whether esophagitis present  Screening for diabetes mellitus -     Hemoglobin A1c  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

## 2023-02-09 LAB — COMPREHENSIVE METABOLIC PANEL
ALT: 23 IU/L (ref 0–32)
AST: 29 IU/L (ref 0–40)
Albumin: 4.3 g/dL (ref 3.8–4.9)
Alkaline Phosphatase: 68 IU/L (ref 44–121)
BUN/Creatinine Ratio: 8 — ABNORMAL LOW (ref 9–23)
BUN: 6 mg/dL (ref 6–24)
Bilirubin Total: 0.2 mg/dL (ref 0.0–1.2)
CO2: 25 mmol/L (ref 20–29)
Calcium: 9.4 mg/dL (ref 8.7–10.2)
Chloride: 103 mmol/L (ref 96–106)
Creatinine, Ser: 0.74 mg/dL (ref 0.57–1.00)
Globulin, Total: 2.3 g/dL (ref 1.5–4.5)
Glucose: 79 mg/dL (ref 70–99)
Potassium: 4.6 mmol/L (ref 3.5–5.2)
Sodium: 139 mmol/L (ref 134–144)
Total Protein: 6.6 g/dL (ref 6.0–8.5)
eGFR: 97 mL/min/{1.73_m2} (ref >59)

## 2023-02-09 LAB — LIPID PANEL
Chol/HDL Ratio: 2.3 ratio (ref 0.0–4.4)
Cholesterol, Total: 174 mg/dL (ref 100–199)
HDL: 75 mg/dL (ref >39)
LDL Chol Calc (NIH): 87 mg/dL (ref 0–99)
Triglycerides: 64 mg/dL (ref 0–149)
VLDL Cholesterol Cal: 12 mg/dL (ref 5–40)

## 2023-02-09 LAB — CBC
Hematocrit: 37.4 % (ref 34.0–46.6)
Hemoglobin: 12.5 g/dL (ref 11.1–15.9)
MCH: 30.4 pg (ref 26.6–33.0)
MCHC: 33.4 g/dL (ref 31.5–35.7)
MCV: 91 fL (ref 79–97)
Platelets: 217 10*3/uL (ref 150–450)
RBC: 4.11 x10E6/uL (ref 3.77–5.28)
RDW: 12.8 % (ref 11.7–15.4)
WBC: 4.5 10*3/uL (ref 3.4–10.8)

## 2023-02-09 LAB — HEMOGLOBIN A1C
Est. average glucose Bld gHb Est-mCnc: 111 mg/dL
Hgb A1c MFr Bld: 5.5 % (ref 4.8–5.6)

## 2023-02-09 LAB — TSH: TSH: 1.03 u[IU]/mL (ref 0.450–4.500)

## 2023-02-09 LAB — VITAMIN D 25 HYDROXY (VIT D DEFICIENCY, FRACTURES): Vit D, 25-Hydroxy: 62.6 ng/mL (ref 30.0–100.0)

## 2023-02-09 LAB — MAGNESIUM: Magnesium: 2.1 mg/dL (ref 1.6–2.3)

## 2023-02-09 NOTE — Progress Notes (Signed)
Results sent through MyChart

## 2023-02-10 ENCOUNTER — Ambulatory Visit (HOSPITAL_COMMUNITY): Admission: RE | Admit: 2023-02-10 | Payer: Commercial Managed Care - PPO | Source: Ambulatory Visit

## 2023-02-10 DIAGNOSIS — I75023 Atheroembolism of bilateral lower extremities: Secondary | ICD-10-CM | POA: Diagnosis not present

## 2023-02-10 LAB — VAS US ABI WITH/WO TBI
Left ABI: 1.1
Right ABI: 1.2

## 2023-02-10 NOTE — Progress Notes (Signed)
Your blood flow screen is normal.  If desired, I can refer you to vascular for further eval given the coloration of your feet.  Let me know

## 2023-02-24 ENCOUNTER — Encounter: Payer: Self-pay | Admitting: Medical

## 2023-02-24 ENCOUNTER — Ambulatory Visit: Payer: Commercial Managed Care - PPO | Admitting: Medical

## 2023-02-24 ENCOUNTER — Other Ambulatory Visit (HOSPITAL_BASED_OUTPATIENT_CLINIC_OR_DEPARTMENT_OTHER): Payer: Self-pay

## 2023-02-24 VITALS — BP 110/70 | HR 94 | Wt 171.8 lb

## 2023-02-24 DIAGNOSIS — Z7689 Persons encountering health services in other specified circumstances: Secondary | ICD-10-CM

## 2023-02-24 DIAGNOSIS — E559 Vitamin D deficiency, unspecified: Secondary | ICD-10-CM | POA: Diagnosis not present

## 2023-02-24 DIAGNOSIS — K219 Gastro-esophageal reflux disease without esophagitis: Secondary | ICD-10-CM

## 2023-02-24 DIAGNOSIS — Z9884 Bariatric surgery status: Secondary | ICD-10-CM

## 2023-02-24 DIAGNOSIS — E538 Deficiency of other specified B group vitamins: Secondary | ICD-10-CM

## 2023-02-24 MED ORDER — OMEPRAZOLE 40 MG PO CPDR
40.0000 mg | DELAYED_RELEASE_CAPSULE | Freq: Every day | ORAL | 1 refills | Status: DC
  Filled 2023-02-24 – 2023-04-24 (×2): qty 90, 90d supply, fill #0
  Filled 2023-07-22 – 2023-07-28 (×2): qty 90, 90d supply, fill #1

## 2023-02-24 MED ORDER — PHENTERMINE HCL 37.5 MG PO TABS
37.5000 mg | ORAL_TABLET | Freq: Every day | ORAL | 3 refills | Status: DC
  Filled 2023-02-24: qty 30, 30d supply, fill #0
  Filled 2023-03-18 – 2023-03-27 (×2): qty 30, 30d supply, fill #1
  Filled 2023-04-24: qty 30, 30d supply, fill #2
  Filled 2023-05-29: qty 30, 30d supply, fill #3

## 2023-02-24 NOTE — Progress Notes (Signed)
Subjective:  Latasha Ramirez is a 52 y.o. female who presents for Chief Complaint  Patient presents with   other    F/u on weight weight has gone up a little bit,      Here for follow up on weight loss efforts  She is compliant with her nebivolol and omeprazole daily.  She takes phentermine daily, 1 tablet daily of the 37.5 mg dose  Not currently taking B12 supplement  Current exercise: Pilates regularly, walking and weights regularly, 4 days per week.    Dietary efforts: Healthy diet, no recent changes.  Most days gets vegetables including green vegetables.  Eating plenty of fruit.   Eats typically 3 meals daily.    Weakness can be at night after dinner, between 8:30-9, may have craving.  Does a protein shake smoothie regularly.    Medication: Phentermine currently 1 tablet daily.   In past was doing better on Saxenda.    When she was on saxenda her periods were better controlled she thinks.  Not currently on birth control or hormones.  Needs letter of medical necessity to use for [facility] monies for her multivitamin and iron given her deficiencies.   No other aggravating or relieving factors.    No other c/o.  Past Medical History:  Diagnosis Date   Anxiety    GERD (gastroesophageal reflux disease)    History of bleeding ulcers    Hypotension    Seizures (HCC)    last seizure 2010.  last EEG 2015    Current Outpatient Medications on File Prior to Visit  Medication Sig Dispense Refill   Multiple Vitamin (MULTIVITAMIN) tablet Take 1 tablet by mouth daily.     nebivolol (BYSTOLIC) 5 MG tablet Take 1 tablet (5 mg total) by mouth daily. 90 tablet 3   No current facility-administered medications on file prior to visit.    The following portions of the patient's history were reviewed and updated as appropriate: allergies, current medications, past family history, past medical history, past social history, past surgical history and problem list.  ROS Otherwise as in subjective  above    Objective: BP 110/70   Pulse 94   Wt 171 lb 12.8 oz (77.9 kg)   LMP 02/07/2023   BMI 28.59 kg/m   Wt Readings from Last 3 Encounters:  02/24/23 171 lb 12.8 oz (77.9 kg)  02/08/23 169 lb 9.6 oz (76.9 kg)  11/25/22 167 lb 3.2 oz (75.8 kg)   Gen: wd, wn, nad Psych: pleasant , good eye contact, answers question appropriately    Assessment: Encounter Diagnoses  Name Primary?   B12 deficiency Yes   Vitamin D deficiency    H/O gastric bypass    Gastroesophageal reflux disease, unspecified whether esophagitis present    Encounter for weight management       Plan: B12 deficiency - she will continue OTC supplement periodically, not daily  Vit D deficiency - continue multivitamin  BMI 28, weight management Discussed risks/benefits of phentermine.   She will continue phentermine but will use sometimes 1 tablet daily alternating with 1/2 tablet daily.   Continue to monitor for side effects and monitor BPs at home and here  Counseled on diet, exercise, limiting salt and lowering animal product consumption, eating more plant based  GERD - doesn't do well off PPI.  Continue current medication   Latasha Ramirez was seen today for other.  Diagnoses and all orders for this visit:  B12 deficiency  Vitamin D deficiency  H/O gastric bypass  Gastroesophageal reflux disease, unspecified whether esophagitis present  Encounter for weight management  Other orders -     phentermine (ADIPEX-P) 37.5 MG tablet; Take 1 tablet (37.5 mg total) by mouth daily before breakfast. -     omeprazole (PRILOSEC) 40 MG capsule; Take 1 capsule (40 mg total) by mouth daily.    Follow up:  61mo

## 2023-02-28 ENCOUNTER — Encounter: Payer: Self-pay | Admitting: Internal Medicine

## 2023-03-01 ENCOUNTER — Other Ambulatory Visit (HOSPITAL_BASED_OUTPATIENT_CLINIC_OR_DEPARTMENT_OTHER): Payer: Self-pay

## 2023-03-10 ENCOUNTER — Other Ambulatory Visit: Payer: Self-pay

## 2023-03-19 ENCOUNTER — Other Ambulatory Visit (HOSPITAL_BASED_OUTPATIENT_CLINIC_OR_DEPARTMENT_OTHER): Payer: Self-pay

## 2023-03-27 ENCOUNTER — Other Ambulatory Visit: Payer: Self-pay

## 2023-03-28 ENCOUNTER — Other Ambulatory Visit (HOSPITAL_BASED_OUTPATIENT_CLINIC_OR_DEPARTMENT_OTHER): Payer: Self-pay

## 2023-03-28 MED ORDER — INFLUENZA VIRUS VACC SPLIT PF (FLUZONE) 0.5 ML IM SUSY
0.5000 mL | PREFILLED_SYRINGE | Freq: Once | INTRAMUSCULAR | 0 refills | Status: AC
  Filled 2023-03-28: qty 0.5, 1d supply, fill #0

## 2023-04-25 ENCOUNTER — Other Ambulatory Visit: Payer: Self-pay

## 2023-04-25 ENCOUNTER — Other Ambulatory Visit (HOSPITAL_BASED_OUTPATIENT_CLINIC_OR_DEPARTMENT_OTHER): Payer: Self-pay

## 2023-05-01 ENCOUNTER — Encounter (HOSPITAL_BASED_OUTPATIENT_CLINIC_OR_DEPARTMENT_OTHER): Payer: Self-pay

## 2023-05-01 DIAGNOSIS — N959 Unspecified menopausal and perimenopausal disorder: Secondary | ICD-10-CM | POA: Diagnosis not present

## 2023-05-02 ENCOUNTER — Other Ambulatory Visit (HOSPITAL_BASED_OUTPATIENT_CLINIC_OR_DEPARTMENT_OTHER): Payer: Self-pay

## 2023-05-03 ENCOUNTER — Other Ambulatory Visit (HOSPITAL_BASED_OUTPATIENT_CLINIC_OR_DEPARTMENT_OTHER): Payer: Self-pay

## 2023-05-12 ENCOUNTER — Ambulatory Visit: Payer: Self-pay | Admitting: Cardiology

## 2023-05-17 ENCOUNTER — Other Ambulatory Visit (HOSPITAL_BASED_OUTPATIENT_CLINIC_OR_DEPARTMENT_OTHER): Payer: Self-pay

## 2023-05-17 DIAGNOSIS — N959 Unspecified menopausal and perimenopausal disorder: Secondary | ICD-10-CM | POA: Diagnosis not present

## 2023-05-17 DIAGNOSIS — Z780 Asymptomatic menopausal state: Secondary | ICD-10-CM | POA: Diagnosis not present

## 2023-05-17 MED ORDER — LEVONORGEST-ETH ESTRAD 91-DAY 0.1-0.02 & 0.01 MG PO TABS
1.0000 | ORAL_TABLET | Freq: Every day | ORAL | 3 refills | Status: DC
  Filled 2023-05-17: qty 91, 91d supply, fill #0

## 2023-05-29 ENCOUNTER — Other Ambulatory Visit: Payer: Self-pay

## 2023-06-01 ENCOUNTER — Ambulatory Visit: Payer: Commercial Managed Care - PPO | Attending: Cardiology | Admitting: Cardiology

## 2023-06-01 ENCOUNTER — Encounter: Payer: Self-pay | Admitting: Cardiology

## 2023-06-01 VITALS — BP 122/72 | HR 77 | Resp 16 | Ht 65.0 in | Wt 186.0 lb

## 2023-06-01 DIAGNOSIS — I493 Ventricular premature depolarization: Secondary | ICD-10-CM | POA: Diagnosis not present

## 2023-06-01 NOTE — Progress Notes (Signed)
  Cardiology Office Note:  .   Date:  06/01/2023  ID:  Latasha Ramirez, DOB 03/25/1971, MRN [id number] PCP: Genia Del  [facility] Providers Cardiologist:  Truett Mainland, MD PCP: Jac Canavan, PA-C  Chief Complaint  Patient presents with   Palpitations   Follow-up      History of Present Illness: .    Latasha Ramirez is a 52 y.o. female with symptomatic PVC's  Patient is doing well, has no complaints today.  PVCs are well-controlled on Bystolic.   Vitals:   06/01/23 0903  BP: 122/72  Pulse: 77  Resp: 16  SpO2: 93%     ROS:  Review of Systems  Cardiovascular:  Negative for chest pain, dyspnea on exertion, leg swelling, palpitations and syncope.     Studies Reviewed: Marland Kitchen        EKG 06/01/2023: Normal sinus rhythm Low voltage QRS When compared with ECG of 14-Jul-2020 09:14, No significant change was found    Independently interpreted 01/2023: Chol 174, TG 64, HDL 75, LDL 87 HbA1C 5.5% Hb 12.5 Cr 0.7 K 4.6    Physical Exam:   Physical Exam Vitals and nursing note reviewed.  Constitutional:      General: She is not in acute distress. Neck:     Vascular: No JVD.  Cardiovascular:     Rate and Rhythm: Normal rate and regular rhythm.     Heart sounds: Normal heart sounds. No murmur heard. Pulmonary:     Effort: Pulmonary effort is normal.     Breath sounds: Normal breath sounds. No wheezing or rales.  Musculoskeletal:     Right lower leg: No edema.     Left lower leg: No edema.      VISIT DIAGNOSES:   ICD-10-CM   1. PVC (premature ventricular contraction)  I49.3 EKG 12-Lead       ASSESSMENT AND PLAN: .    Ohara Brosh is a 52 y.o. female with symptomatic PVC's   Symptomatic PVC's: Well-controlled on Bystolic. Bystolic preferred, instead of propranolol (due to side effects) or generic nebivolol (which she has not tolerated).   Leg swelling: Occasional. Okay to use compression stockings.     No orders of the  defined types were placed in this encounter.    F/u in 1 year  Signed, Elder Negus, MD

## 2023-06-01 NOTE — Patient Instructions (Signed)
 Medication Instructions:   Your physician recommends that you continue on your current medications as directed. Please refer to the Current Medication list given to you today.  *If you need a refill on your cardiac medications before your next appointment, please call your pharmacy*   Follow-Up: At [facility], you and your health needs are our priority.  As part of our continuing mission to provide you with exceptional heart care, we have created designated Provider Care Teams.  These Care Teams include your primary Cardiologist (physician) and Advanced Practice Providers (APPs -  Physician Assistants and Nurse Practitioners) who all work together to provide you with the care you need, when you need it.  We recommend signing up for the patient portal called "MyChart".  Sign up information is provided on this After Visit Summary.  MyChart is used to connect with patients for Virtual Visits (Telemedicine).  Patients are able to view lab/test results, encounter notes, upcoming appointments, etc.  Non-urgent messages can be sent to your provider as well.   To learn more about what you can do with MyChart, go to [email or web address].    Your next appointment:   1 year(s)  Provider:   DR. Rosemary Holms

## 2023-07-11 DIAGNOSIS — Z124 Encounter for screening for malignant neoplasm of cervix: Secondary | ICD-10-CM | POA: Diagnosis not present

## 2023-07-11 DIAGNOSIS — Z01419 Encounter for gynecological examination (general) (routine) without abnormal findings: Secondary | ICD-10-CM | POA: Diagnosis not present

## 2023-07-11 DIAGNOSIS — Z6832 Body mass index (BMI) 32.0-32.9, adult: Secondary | ICD-10-CM | POA: Diagnosis not present

## 2023-07-12 ENCOUNTER — Other Ambulatory Visit (HOSPITAL_BASED_OUTPATIENT_CLINIC_OR_DEPARTMENT_OTHER): Payer: Self-pay

## 2023-07-12 MED ORDER — PROGESTERONE MICRONIZED 100 MG PO CAPS
100.0000 mg | ORAL_CAPSULE | Freq: Every day | ORAL | 12 refills | Status: AC
  Filled 2023-07-12: qty 30, 30d supply, fill #0
  Filled 2023-07-28 – 2023-08-07 (×2): qty 30, 30d supply, fill #1
  Filled 2023-09-06: qty 30, 30d supply, fill #2
  Filled 2023-10-06: qty 30, 30d supply, fill #3
  Filled 2023-10-31 – 2023-11-03 (×2): qty 30, 30d supply, fill #4
  Filled 2023-12-05: qty 30, 30d supply, fill #5
  Filled 2024-01-06: qty 30, 30d supply, fill #6
  Filled 2024-02-06: qty 30, 30d supply, fill #7
  Filled 2024-03-07 – 2024-03-11 (×2): qty 30, 30d supply, fill #8

## 2023-07-12 MED ORDER — ESTRADIOL 2 MG PO TABS
2.0000 mg | ORAL_TABLET | Freq: Every day | ORAL | 12 refills | Status: AC
  Filled 2023-07-12: qty 30, 30d supply, fill #0
  Filled 2023-07-28 – 2023-08-07 (×2): qty 30, 30d supply, fill #1
  Filled 2023-09-06: qty 30, 30d supply, fill #2
  Filled 2023-10-06: qty 30, 30d supply, fill #3
  Filled 2023-10-31 – 2023-11-03 (×2): qty 30, 30d supply, fill #4
  Filled 2023-12-05: qty 30, 30d supply, fill #5
  Filled 2024-01-06: qty 30, 30d supply, fill #6
  Filled 2024-02-06: qty 30, 30d supply, fill #7
  Filled 2024-03-07 – 2024-03-11 (×2): qty 30, 30d supply, fill #8

## 2023-07-21 ENCOUNTER — Ambulatory Visit: Payer: Commercial Managed Care - PPO | Admitting: Medical

## 2023-07-21 ENCOUNTER — Other Ambulatory Visit (HOSPITAL_BASED_OUTPATIENT_CLINIC_OR_DEPARTMENT_OTHER): Payer: Self-pay

## 2023-07-21 VITALS — BP 122/80 | HR 76 | Wt 194.4 lb

## 2023-07-21 DIAGNOSIS — Z8639 Personal history of other endocrine, nutritional and metabolic disease: Secondary | ICD-10-CM

## 2023-07-21 DIAGNOSIS — R635 Abnormal weight gain: Secondary | ICD-10-CM | POA: Diagnosis not present

## 2023-07-21 DIAGNOSIS — F419 Anxiety disorder, unspecified: Secondary | ICD-10-CM | POA: Diagnosis not present

## 2023-07-21 DIAGNOSIS — E538 Deficiency of other specified B group vitamins: Secondary | ICD-10-CM | POA: Diagnosis not present

## 2023-07-21 DIAGNOSIS — Z131 Encounter for screening for diabetes mellitus: Secondary | ICD-10-CM | POA: Diagnosis not present

## 2023-07-21 DIAGNOSIS — K219 Gastro-esophageal reflux disease without esophagitis: Secondary | ICD-10-CM

## 2023-07-21 DIAGNOSIS — E559 Vitamin D deficiency, unspecified: Secondary | ICD-10-CM | POA: Diagnosis not present

## 2023-07-21 DIAGNOSIS — Z9884 Bariatric surgery status: Secondary | ICD-10-CM | POA: Diagnosis not present

## 2023-07-21 LAB — HEMOGLOBIN A1C
Est. average glucose Bld gHb Est-mCnc: 114 mg/dL
Hgb A1c MFr Bld: 5.6 % (ref 4.8–5.6)

## 2023-07-21 MED ORDER — NALTREXONE-BUPROPION HCL ER 8-90 MG PO TB12
2.0000 | ORAL_TABLET | Freq: Two times a day (BID) | ORAL | 1 refills | Status: DC
  Filled 2023-07-21 – 2023-07-24 (×2): qty 120, 30d supply, fill #0
  Filled 2023-09-06: qty 120, 30d supply, fill #1

## 2023-07-21 NOTE — Progress Notes (Signed)
Subjective:  Latasha Ramirez is a 53 y.o. female who presents for Chief Complaint  Patient presents with   Medical Management of Chronic Issues    Med check-  phetermine making her gain weight     Patient Care Team: Saida Lonon, Cleda Mccreedy as PCP - General (Family Medicine) Van Clines, MD as Consulting Physician (Neurology) Candice Camp, MD as Consulting Physician (Obstetrics and Gynecology) Elder Negus, MD as Consulting Physician (Cardiology)  Concerns: Here for med check.  Has seen gyn recently and finally got on some hormones for menopausal symptoms.  She just started hormonal therapy last week.  Walking for exercise on incline on treadmill, does a specific program which increase incline and endurance on treadmill.   Does this several days per week. .  Diet - has cravings for sweets or chocolate.  Is careful with diet in general.  Is limiting carbs, has been doing more protein intake.     Takes some supplements including hair vitamin, biotin supplement, ashwaganda supplement.    Thinks she has hx/o raynaud's   she gets purplish or even white-colored extremities" when her months.  She has to wear layers.  She is frustrated that she is gaining weight despite really trying harder with exercise and diet.  She was on phentermine but was not seeing any benefit recently even though she is seeing benefit with that in the past  She would like to try something different.  She has not tolerated Topamax in the past  No other aggravating or relieving factors.    No other c/o.  Past Medical History:  Diagnosis Date   Anxiety    GERD (gastroesophageal reflux disease)    History of bleeding ulcers    Hypotension    Seizures (HCC)    last seizure 2010.  last EEG 2015   Current Outpatient Medications on File Prior to Visit  Medication Sig Dispense Refill   estradiol (ESTRACE) 2 MG tablet Take 1 tablet (2 mg total) by mouth daily. 30 tablet 12   Levonorgestrel-Ethinyl  Estradiol (CAMRESE LO) 0.1-0.02 & 0.01 MG tablet Take 1 tablet by mouth daily. 91 tablet 3   Multiple Vitamin (MULTIVITAMIN) tablet Take 1 tablet by mouth daily.     nebivolol (BYSTOLIC) 5 MG tablet Take 1 tablet (5 mg total) by mouth daily. 90 tablet 3   omeprazole (PRILOSEC) 40 MG capsule Take 1 capsule (40 mg total) by mouth daily. 90 capsule 1   progesterone (PROMETRIUM) 100 MG capsule Take 1 capsule (100 mg total) by mouth at bedtime. 30 capsule 12   No current facility-administered medications on file prior to visit.     The following portions of the patient's history were reviewed and updated as appropriate: allergies, current medications, past family history, past medical history, past social history, past surgical history and problem list.  ROS Otherwise as in subjective above    Objective: BP 122/80   Pulse 76   Wt 194 lb 6.4 oz (88.2 kg)   BMI 32.35 kg/m   Wt Readings from Last 3 Encounters:  07/21/23 194 lb 6.4 oz (88.2 kg)  06/01/23 186 lb (84.4 kg)  02/24/23 171 lb 12.8 oz (77.9 kg)    BP Readings from Last 3 Encounters:  07/21/23 122/80  06/01/23 122/72  02/24/23 110/70   General appearance: alert, no distress, well developed, well nourished Neck: supple, no lymphadenopathy, no thyromegaly, no masses Heart: RRR, normal S1, S2, no murmurs Lungs: CTA bilaterally, no wheezes, rhonchi, or rales Pulses:  2+ radial pulses, 2+ pedal pulses, normal cap refill Ext: no edema     Assessment: Encounter Diagnoses  Name Primary?   Anxiety Yes   Vitamin D deficiency    H/O gastric bypass    Gastroesophageal reflux disease, unspecified whether esophagitis present    B12 deficiency    Weight gain    History of iron deficiency      Plan: Begin trial of Contrave.  Failed phentermine and Topamax.  Would like to use one of the GLP-1 medications but not sure if insurance is going to pay for that this year.  Continue efforts with diet and exercise.  Counseled patient  on exercise strategies.  Counseled on diet strategies.  Labs today at her request to further evaluate concerns about weight gain and vitamin deficiency.  She has been low on vitamin D and iron in the past and B12.  She has a history of gastric bypass surgery.    Sherrelle was seen today for medical management of chronic issues.  Diagnoses and all orders for this visit:  Anxiety -     TSH + free T4 -     T3  Vitamin D deficiency -     VITAMIN D 25 Hydroxy (Vit-D Deficiency, Fractures)  H/O gastric bypass -     Cortisol -     TSH + free T4 -     Magnesium -     T3 -     Vitamin B12  Gastroesophageal reflux disease, unspecified whether esophagitis present  B12 deficiency -     Vitamin B12  Weight gain -     Cortisol -     TSH + free T4 -     Magnesium -     VITAMIN D 25 Hydroxy (Vit-D Deficiency, Fractures) -     Iron, TIBC and Ferritin Panel -     T3 -     Vitamin B12  History of iron deficiency -     Iron, TIBC and Ferritin Panel  Other orders -     Naltrexone-buPROPion HCl ER 8-90 MG TB12; Take 2 tablets by mouth 2 (two) times daily with a meal.    Follow up: pending labs

## 2023-07-21 NOTE — Addendum Note (Signed)
Addended by: Herminio Commons A on: 07/21/2023 10:45 AM   Modules accepted: Orders

## 2023-07-22 ENCOUNTER — Other Ambulatory Visit: Payer: Self-pay | Admitting: Cardiology

## 2023-07-22 ENCOUNTER — Other Ambulatory Visit (HOSPITAL_BASED_OUTPATIENT_CLINIC_OR_DEPARTMENT_OTHER): Payer: Self-pay

## 2023-07-22 LAB — IRON,TIBC AND FERRITIN PANEL
Ferritin: 9 ng/mL — ABNORMAL LOW (ref 15–150)
Iron Saturation: 8 % — CL (ref 15–55)
Iron: 30 ug/dL (ref 27–159)
Total Iron Binding Capacity: 380 ug/dL (ref 250–450)
UIBC: 350 ug/dL (ref 131–425)

## 2023-07-22 LAB — VITAMIN B12: Vitamin B-12: 924 pg/mL (ref 232–1245)

## 2023-07-22 LAB — VITAMIN D 25 HYDROXY (VIT D DEFICIENCY, FRACTURES): Vit D, 25-Hydroxy: 41.2 ng/mL (ref 30.0–100.0)

## 2023-07-22 LAB — TSH+FREE T4
Free T4: 1.04 ng/dL (ref 0.82–1.77)
TSH: 1.2 u[IU]/mL (ref 0.450–4.500)

## 2023-07-22 LAB — MAGNESIUM: Magnesium: 2.1 mg/dL (ref 1.6–2.3)

## 2023-07-22 LAB — T3: T3, Total: 115 ng/dL (ref 71–180)

## 2023-07-22 LAB — CORTISOL: Cortisol: 4.9 ug/dL — ABNORMAL LOW (ref 6.2–19.4)

## 2023-07-23 ENCOUNTER — Other Ambulatory Visit: Payer: Self-pay | Admitting: Medical

## 2023-07-23 ENCOUNTER — Other Ambulatory Visit (HOSPITAL_BASED_OUTPATIENT_CLINIC_OR_DEPARTMENT_OTHER): Payer: Self-pay

## 2023-07-23 DIAGNOSIS — R7989 Other specified abnormal findings of blood chemistry: Secondary | ICD-10-CM

## 2023-07-23 DIAGNOSIS — R82998 Other abnormal findings in urine: Secondary | ICD-10-CM

## 2023-07-23 MED ORDER — FERROUS GLUCONATE 324 (38 FE) MG PO TABS
324.0000 mg | ORAL_TABLET | Freq: Two times a day (BID) | ORAL | 2 refills | Status: DC
  Filled 2023-07-23: qty 60, 30d supply, fill #0

## 2023-07-23 NOTE — Progress Notes (Signed)
Results sent through MyChart

## 2023-07-24 ENCOUNTER — Other Ambulatory Visit (HOSPITAL_BASED_OUTPATIENT_CLINIC_OR_DEPARTMENT_OTHER): Payer: Self-pay

## 2023-07-24 ENCOUNTER — Other Ambulatory Visit: Payer: Self-pay

## 2023-07-24 MED ORDER — NEBIVOLOL HCL 5 MG PO TABS
5.0000 mg | ORAL_TABLET | Freq: Every day | ORAL | 3 refills | Status: AC
  Filled 2023-07-24 – 2023-08-03 (×2): qty 90, 90d supply, fill #0
  Filled 2023-10-06 – 2023-10-31 (×3): qty 90, 90d supply, fill #1
  Filled 2024-01-30: qty 90, 90d supply, fill #2

## 2023-07-25 ENCOUNTER — Other Ambulatory Visit: Payer: Self-pay

## 2023-07-25 ENCOUNTER — Other Ambulatory Visit (HOSPITAL_COMMUNITY): Payer: Self-pay

## 2023-07-25 ENCOUNTER — Other Ambulatory Visit (HOSPITAL_BASED_OUTPATIENT_CLINIC_OR_DEPARTMENT_OTHER): Payer: Self-pay

## 2023-07-25 ENCOUNTER — Telehealth: Payer: Self-pay

## 2023-07-25 NOTE — Telephone Encounter (Signed)
Pharmacy Patient Advocate Encounter   Received notification from CoverMyMeds that prior authorization for Contrave 8-90MG  er tablets is required/requested.   Insurance verification completed.   The patient is insured through [facility] .   Per test claim: PA required; PA submitted to above mentioned insurance via CoverMyMeds Key/confirmation #/EOC (Key: [id number])     Status is pending

## 2023-07-27 ENCOUNTER — Other Ambulatory Visit: Payer: Commercial Managed Care - PPO

## 2023-07-27 DIAGNOSIS — R7989 Other specified abnormal findings of blood chemistry: Secondary | ICD-10-CM | POA: Diagnosis not present

## 2023-07-28 ENCOUNTER — Other Ambulatory Visit (HOSPITAL_COMMUNITY): Payer: Self-pay

## 2023-07-28 ENCOUNTER — Other Ambulatory Visit (HOSPITAL_BASED_OUTPATIENT_CLINIC_OR_DEPARTMENT_OTHER): Payer: Self-pay

## 2023-07-28 ENCOUNTER — Other Ambulatory Visit: Payer: Self-pay

## 2023-07-28 LAB — CORTISOL: Cortisol: 10.3 ug/dL (ref 6.2–19.4)

## 2023-07-28 NOTE — Telephone Encounter (Signed)
Pharmacy Patient Advocate Encounter  Received notification from [facility] that Prior Authorization for Contrave 8-90MG  er  has been APPROVED from 1.30.25 to 4.30.25. Ran test claim, Copay is $24.97. This test claim was processed through [facility]- copay amounts may vary at other pharmacies due to pharmacy/plan contracts, or as the patient moves through the different stages of their insurance plan.   PA #/Case ID/Reference #: (Key: [id number])

## 2023-07-29 ENCOUNTER — Other Ambulatory Visit (HOSPITAL_BASED_OUTPATIENT_CLINIC_OR_DEPARTMENT_OTHER): Payer: Self-pay

## 2023-07-30 NOTE — Progress Notes (Signed)
 Results sent through MyChart

## 2023-08-01 ENCOUNTER — Other Ambulatory Visit (HOSPITAL_COMMUNITY): Payer: Self-pay

## 2023-08-01 DIAGNOSIS — R1031 Right lower quadrant pain: Secondary | ICD-10-CM | POA: Diagnosis not present

## 2023-08-02 ENCOUNTER — Other Ambulatory Visit (HOSPITAL_COMMUNITY): Payer: Self-pay

## 2023-08-03 ENCOUNTER — Other Ambulatory Visit: Payer: Self-pay | Admitting: Obstetrics and Gynecology

## 2023-08-03 ENCOUNTER — Other Ambulatory Visit (HOSPITAL_BASED_OUTPATIENT_CLINIC_OR_DEPARTMENT_OTHER): Payer: Self-pay

## 2023-08-03 DIAGNOSIS — Z1239 Encounter for other screening for malignant neoplasm of breast: Secondary | ICD-10-CM

## 2023-08-03 DIAGNOSIS — Z1231 Encounter for screening mammogram for malignant neoplasm of breast: Secondary | ICD-10-CM

## 2023-08-10 ENCOUNTER — Ambulatory Visit
Admission: RE | Admit: 2023-08-10 | Discharge: 2023-08-10 | Disposition: A | Discharge status: Home / Self Care | Payer: Commercial Managed Care - PPO | Source: Ambulatory Visit | Attending: Obstetrics and Gynecology | Admitting: Obstetrics and Gynecology

## 2023-08-10 DIAGNOSIS — Z1231 Encounter for screening mammogram for malignant neoplasm of breast: Secondary | ICD-10-CM

## 2023-08-22 DIAGNOSIS — Z6832 Body mass index (BMI) 32.0-32.9, adult: Secondary | ICD-10-CM | POA: Diagnosis not present

## 2023-08-22 DIAGNOSIS — Z713 Dietary counseling and surveillance: Secondary | ICD-10-CM | POA: Diagnosis not present

## 2023-10-06 ENCOUNTER — Other Ambulatory Visit: Payer: Self-pay | Admitting: Medical

## 2023-10-06 ENCOUNTER — Other Ambulatory Visit (HOSPITAL_BASED_OUTPATIENT_CLINIC_OR_DEPARTMENT_OTHER): Payer: Self-pay

## 2023-10-06 ENCOUNTER — Other Ambulatory Visit: Payer: Self-pay

## 2023-10-06 MED ORDER — OMEPRAZOLE 40 MG PO CPDR
40.0000 mg | DELAYED_RELEASE_CAPSULE | Freq: Every day | ORAL | 1 refills | Status: DC
  Filled 2023-10-06 – 2023-11-03 (×3): qty 90, 90d supply, fill #0
  Filled 2024-01-30: qty 90, 90d supply, fill #1

## 2023-10-06 MED ORDER — CONTRAVE 8-90 MG PO TB12
2.0000 | ORAL_TABLET | Freq: Two times a day (BID) | ORAL | 1 refills | Status: DC
  Filled 2023-10-06: qty 120, 30d supply, fill #0
  Filled 2023-10-31: qty 120, 30d supply, fill #1

## 2023-10-11 ENCOUNTER — Other Ambulatory Visit (HOSPITAL_BASED_OUTPATIENT_CLINIC_OR_DEPARTMENT_OTHER): Payer: Self-pay

## 2023-10-15 ENCOUNTER — Other Ambulatory Visit: Payer: Self-pay | Admitting: Medical

## 2023-10-15 DIAGNOSIS — R251 Tremor, unspecified: Secondary | ICD-10-CM

## 2023-10-23 DIAGNOSIS — Z9229 Personal history of other drug therapy: Secondary | ICD-10-CM

## 2023-10-23 NOTE — Telephone Encounter (Signed)
 Pt says she prefers [facility] and if you need to let her know any other information to please send her a My Chart message.

## 2023-10-31 ENCOUNTER — Other Ambulatory Visit (HOSPITAL_BASED_OUTPATIENT_CLINIC_OR_DEPARTMENT_OTHER): Payer: Self-pay

## 2023-11-01 ENCOUNTER — Other Ambulatory Visit (HOSPITAL_BASED_OUTPATIENT_CLINIC_OR_DEPARTMENT_OTHER): Payer: Self-pay

## 2023-11-01 ENCOUNTER — Other Ambulatory Visit: Payer: Self-pay

## 2023-11-03 ENCOUNTER — Other Ambulatory Visit (HOSPITAL_COMMUNITY): Payer: Self-pay

## 2023-11-03 ENCOUNTER — Other Ambulatory Visit (HOSPITAL_BASED_OUTPATIENT_CLINIC_OR_DEPARTMENT_OTHER): Payer: Self-pay

## 2023-11-03 ENCOUNTER — Telehealth: Payer: Self-pay

## 2023-11-03 NOTE — Telephone Encounter (Signed)
 Hello,            I have a received a Prior Authorization Renewal Rquest for the patients Contrave  8-90MG  er tablets. However the pts plan requires a check-in to see if the pt  has had a 5% weight loss. At this time I dont see a recent weigh in since starting. Please advise?

## 2023-11-06 NOTE — Telephone Encounter (Signed)
 If she can send a picture of the weight then we can go off that, I can't go off by word since its a weight loss medication, insurance has to be verified

## 2023-11-08 ENCOUNTER — Other Ambulatory Visit: Payer: Self-pay

## 2023-11-09 ENCOUNTER — Other Ambulatory Visit: Payer: Self-pay

## 2023-11-13 ENCOUNTER — Other Ambulatory Visit (HOSPITAL_BASED_OUTPATIENT_CLINIC_OR_DEPARTMENT_OTHER): Payer: Self-pay

## 2023-11-14 ENCOUNTER — Telehealth (INDEPENDENT_AMBULATORY_CARE_PROVIDER_SITE_OTHER): Admitting: Medical

## 2023-11-14 ENCOUNTER — Other Ambulatory Visit (HOSPITAL_BASED_OUTPATIENT_CLINIC_OR_DEPARTMENT_OTHER): Payer: Self-pay

## 2023-11-14 ENCOUNTER — Encounter: Payer: Self-pay | Admitting: Medical

## 2023-11-14 VITALS — HR 76 | Wt 182.0 lb

## 2023-11-14 DIAGNOSIS — Z6830 Body mass index (BMI) 30.0-30.9, adult: Secondary | ICD-10-CM | POA: Diagnosis not present

## 2023-11-14 DIAGNOSIS — E559 Vitamin D deficiency, unspecified: Secondary | ICD-10-CM | POA: Diagnosis not present

## 2023-11-14 DIAGNOSIS — I493 Ventricular premature depolarization: Secondary | ICD-10-CM

## 2023-11-14 DIAGNOSIS — Z7689 Persons encountering health services in other specified circumstances: Secondary | ICD-10-CM | POA: Diagnosis not present

## 2023-11-14 DIAGNOSIS — D509 Iron deficiency anemia, unspecified: Secondary | ICD-10-CM

## 2023-11-14 DIAGNOSIS — Z79899 Other long term (current) drug therapy: Secondary | ICD-10-CM

## 2023-11-14 DIAGNOSIS — Z9884 Bariatric surgery status: Secondary | ICD-10-CM

## 2023-11-14 MED ORDER — CONTRAVE 8-90 MG PO TB12
2.0000 | ORAL_TABLET | Freq: Two times a day (BID) | ORAL | 2 refills | Status: DC
  Filled 2023-11-14: qty 120, 30d supply, fill #0
  Filled 2024-01-06: qty 120, 30d supply, fill #1

## 2023-11-14 NOTE — Progress Notes (Signed)
 Subjective:     Patient ID: Latasha Ramirez, female   DOB: 19-Oct-1970, 53 y.o.   MRN: [id number]  This visit type was conducted due to national recommendations for restrictions regarding the COVID-19 Pandemic (e.g. social distancing) in an effort to limit this patient's exposure and mitigate transmission in our community.  Due to their co-morbid illnesses, this patient is at least at moderate risk for complications without adequate follow up.  This format is felt to be most appropriate for this patient at this time.    Documentation for virtual audio and video telecommunications through [facility] encounter:  The patient was located at home. The provider was located in the office. The patient did consent to this visit and is aware of possible charges through their insurance for this visit.  The other persons participating in this telemedicine service were none. Time spent on call was 20 minutes and in review of previous records 20 minutes total.  This virtual service is not related to other E/M service within previous 7 days.   HPI Chief Complaint  Patient presents with   other    F/u on medication, no other issues,    Here for weight loss medication follow-up, weight management.  She started Contrave  3 months ago.  It does help with cravings.  She has been able to lose some weight.  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  She feels like she is pretty careful with her diet  No particular adverse effects from the medication.  She has a history of iron  deficiency anemia and is taking the iron  daily.  No bleeding or bruising.  No heavy menstrual bleeding.  No other aggravating or relieving factors. No other complaint.  Past Medical History:  Diagnosis Date   Anxiety    GERD (gastroesophageal reflux disease)    History of bleeding ulcers    Hypotension     Seizures (HCC)    last seizure 2010.  last EEG 2015   Current Outpatient Medications on File Prior to Visit  Medication Sig Dispense Refill   estradiol  (ESTRACE ) 2 MG tablet Take 1 tablet (2 mg total) by mouth daily. 30 tablet 12   nebivolol  (BYSTOLIC ) 5 MG tablet Take 1 tablet (5 mg total) by mouth daily. 90 tablet 3   omeprazole  (PRILOSEC) 40 MG capsule Take 1 capsule (40 mg total) by mouth daily. 90 capsule 1   progesterone  (PROMETRIUM ) 100 MG capsule Take 1 capsule (100 mg total) by mouth at bedtime. 30 capsule 12   ferrous gluconate  (FERGON) 324 MG tablet Take 1 tablet (324 mg total) by mouth 2 (two) times daily with a meal. 60 tablet 2   Levonorgestrel-Ethinyl Estradiol  (CAMRESE LO) 0.1-0.02 & 0.01 MG tablet Take 1 tablet by mouth daily. 91 tablet 3   Multiple Vitamin (MULTIVITAMIN) tablet Take 1 tablet by mouth daily.     No current facility-administered medications on file prior to visit.     Review of Systems As in subjective    Objective:   Physical Exam Due to coronavirus pandemic stay at home measures, patient visit was virtual and they were not examined in person.   Pulse 76   Wt 182 lb (82.6 kg)   BMI 30.29 kg/m   Wt Readings from Last 3 Encounters:  11/14/23 182 lb (82.6 kg)  07/21/23 194 lb 6.4 oz (88.2 kg)  06/01/23 186 lb (84.4 kg)   Gen: wd, wn, nad      Assessment:     Encounter Diagnoses  Name Primary?   Iron  deficiency anemia, unspecified iron  deficiency anemia type Yes   Encounter for weight management    Medication management    BMI 30.0-30.9,adult    H/O gastric bypass    Symptomatic PVCs    Vitamin D  deficiency        Plan:     Iron  deficiency anemia-return at your convenience for iron  and blood counts  Doing okay on Contrave .  We will also look into a local weight loss study.  If she does not qualify for the study we will continue with the Contrave   Counseled on exercise, diet, continue efforts to lose weight  Monitor blood  pressures particularly if she loses another 15 to 20 pounds since she is on beta-blocker for PVCs.  Vitamin D  deficiency-continue supplement  [name] was seen today for other.  Diagnoses and all orders for this visit:  Iron  deficiency anemia, unspecified iron  deficiency anemia type -     CBC with Differential/Platelet; Future -     Iron , TIBC and Ferritin Panel; Future  Encounter for weight management  Medication management  BMI 30.0-30.9,adult  H/O gastric bypass  Symptomatic PVCs  Vitamin D  deficiency  Other orders -     Naltrexone -buPROPion  HCl ER (CONTRAVE ) 8-90 MG TB12; Take 2 tablets by mouth 2 (two) times daily with a meal.    F/u 3 months

## 2023-11-14 NOTE — Progress Notes (Signed)
 Faxed pharmquest study

## 2023-11-17 ENCOUNTER — Other Ambulatory Visit (HOSPITAL_BASED_OUTPATIENT_CLINIC_OR_DEPARTMENT_OTHER): Payer: Self-pay

## 2023-11-19 ENCOUNTER — Other Ambulatory Visit: Payer: Self-pay

## 2023-11-21 ENCOUNTER — Other Ambulatory Visit (HOSPITAL_BASED_OUTPATIENT_CLINIC_OR_DEPARTMENT_OTHER): Payer: Self-pay

## 2023-11-22 ENCOUNTER — Other Ambulatory Visit (HOSPITAL_BASED_OUTPATIENT_CLINIC_OR_DEPARTMENT_OTHER): Payer: Self-pay

## 2023-11-22 ENCOUNTER — Other Ambulatory Visit (HOSPITAL_COMMUNITY): Payer: Self-pay

## 2023-11-22 ENCOUNTER — Telehealth: Payer: Self-pay

## 2023-11-22 NOTE — Telephone Encounter (Signed)
 Pharmacy Patient Advocate Encounter   Received notification from Patient Pharmacy that prior authorization for Contrave  8-90MG  er tablets is required/requested.   Insurance verification completed.   The patient is insured through [facility] .   Per test claim: PA required; PA submitted to above mentioned insurance via CoverMyMeds Key/confirmation #/EOC (Key: [id number])     Status is pending

## 2023-11-22 NOTE — Telephone Encounter (Signed)
 Pharmacy Patient Advocate Encounter  Received notification from [employer] that Prior Authorization for Contrave  8-90MG  er tablets has been APPROVED from 5.28.25 to 5.28.26. Ran test claim, Copay is $RTS, RX WAS LAST FILLED ON 5.28.25. This test claim was processed through [facility]- copay amounts may vary at other pharmacies due to pharmacy/plan contracts, or as the patient moves through the different stages of their insurance plan.   PA #/Case ID/Reference #: (Key: [id number])

## 2024-01-30 ENCOUNTER — Other Ambulatory Visit (HOSPITAL_BASED_OUTPATIENT_CLINIC_OR_DEPARTMENT_OTHER): Payer: Self-pay

## 2024-01-30 ENCOUNTER — Other Ambulatory Visit: Payer: Self-pay

## 2024-01-31 ENCOUNTER — Other Ambulatory Visit (HOSPITAL_BASED_OUTPATIENT_CLINIC_OR_DEPARTMENT_OTHER): Payer: Self-pay

## 2024-02-03 ENCOUNTER — Other Ambulatory Visit (HOSPITAL_BASED_OUTPATIENT_CLINIC_OR_DEPARTMENT_OTHER): Payer: Self-pay

## 2024-02-08 ENCOUNTER — Ambulatory Visit
Admission: RE | Admit: 2024-02-08 | Discharge: 2024-02-08 | Disposition: A | Discharge status: Home / Self Care | Payer: Commercial Managed Care - PPO | Source: Ambulatory Visit | Attending: Obstetrics and Gynecology | Admitting: Obstetrics and Gynecology

## 2024-02-08 ENCOUNTER — Ambulatory Visit: Payer: Commercial Managed Care - PPO | Admitting: Medical

## 2024-02-08 ENCOUNTER — Encounter: Payer: Self-pay | Admitting: Medical

## 2024-02-08 VITALS — BP 110/70 | HR 64 | Ht 64.5 in | Wt 194.0 lb

## 2024-02-08 DIAGNOSIS — Z9884 Bariatric surgery status: Secondary | ICD-10-CM

## 2024-02-08 DIAGNOSIS — R928 Other abnormal and inconclusive findings on diagnostic imaging of breast: Secondary | ICD-10-CM | POA: Diagnosis not present

## 2024-02-08 DIAGNOSIS — G40909 Epilepsy, unspecified, not intractable, without status epilepticus: Secondary | ICD-10-CM

## 2024-02-08 DIAGNOSIS — Z1329 Encounter for screening for other suspected endocrine disorder: Secondary | ICD-10-CM

## 2024-02-08 DIAGNOSIS — Z7185 Encounter for immunization safety counseling: Secondary | ICD-10-CM | POA: Diagnosis not present

## 2024-02-08 DIAGNOSIS — Z0000 Encounter for general adult medical examination without abnormal findings: Secondary | ICD-10-CM

## 2024-02-08 DIAGNOSIS — K5909 Other constipation: Secondary | ICD-10-CM

## 2024-02-08 DIAGNOSIS — Z13220 Encounter for screening for lipoid disorders: Secondary | ICD-10-CM

## 2024-02-08 DIAGNOSIS — I493 Ventricular premature depolarization: Secondary | ICD-10-CM

## 2024-02-08 DIAGNOSIS — G250 Essential tremor: Secondary | ICD-10-CM

## 2024-02-08 DIAGNOSIS — E559 Vitamin D deficiency, unspecified: Secondary | ICD-10-CM

## 2024-02-08 DIAGNOSIS — Z862 Personal history of diseases of the blood and blood-forming organs and certain disorders involving the immune mechanism: Secondary | ICD-10-CM

## 2024-02-08 DIAGNOSIS — Z1239 Encounter for other screening for malignant neoplasm of breast: Secondary | ICD-10-CM

## 2024-02-08 DIAGNOSIS — Z803 Family history of malignant neoplasm of breast: Secondary | ICD-10-CM | POA: Diagnosis not present

## 2024-02-08 MED ORDER — GADOPICLENOL 0.5 MMOL/ML IV SOLN
9.0000 mL | Freq: Once | INTRAVENOUS | Status: AC | PRN
  Administered 2024-02-08: 9 mL via INTRAVENOUS

## 2024-02-08 NOTE — Progress Notes (Signed)
 Subjective:   HPI  Latasha Ramirez is a 53 y.o. female who presents for Chief Complaint  Patient presents with   Annual Exam    Fasting cpe, no concerns    Patient Care Team: Zakhia Seres, Alm RAMAN, PA-C as PCP - General (Family Medicine) Georjean Darice HERO, MD as Consulting Physician (Neurology) Marget Alm, MD as Consulting Physician (Obstetrics and Gynecology) Elmira Newman PARAS, MD as Consulting Physician (Cardiology) Sees dentist Sees eye doctor, [place] Dr. Glendia Maffucci, [facility] [place]   Concerns: Doing ok.    Getting married in 04/2024.   Sees gyn  She has history of negative BRCA gene testing  Sees neurology for seizure disorder  On bystolic  for palpations.  Doesn't do well on generic for bystolic    Past Medical History:  Diagnosis Date   Anxiety 2022   work related   Essential tremor    GERD (gastroesophageal reflux disease)    History of bleeding ulcers    Hypotension    PVC (premature ventricular contraction)    Seizures (HCC)    last seizure 2010.  last EEG 2015    Family History  Problem Relation Age of Onset   Ovarian cancer Mother    Cancer Mother 53       ovarian   Heart disease Father 30       died with MI   Diabetes Father    Breast cancer Maternal Aunt 30   Cancer Paternal Aunt        breast   Cancer Paternal Aunt        breast   Cancer Paternal Uncle        bladder   Diabetes Maternal Grandmother    Suicidality Maternal Grandfather    Diabetes Paternal Grandmother    Skin cancer Paternal Grandfather      Current Outpatient Medications:    estradiol  (ESTRACE ) 2 MG tablet, Take 1 tablet (2 mg total) by mouth daily., Disp: 30 tablet, Rfl: 12   nebivolol  (BYSTOLIC ) 5 MG tablet, Take 1 tablet (5 mg total) by mouth daily., Disp: 90 tablet, Rfl: 3   omeprazole  (PRILOSEC) 40 MG capsule, Take 1 capsule (40 mg total) by mouth daily., Disp: 90 capsule, Rfl: 1   progesterone  (PROMETRIUM ) 100 MG capsule, Take 1 capsule (100 mg total)  by mouth at bedtime., Disp: 30 capsule, Rfl: 12  Allergies  Allergen Reactions   Other Rash    Foam tape cause blisters / all adhesives cause contact rash    Adhesive [Tape]    Flexeril [Cyclobenzaprine]     Palpitations on Flexeril and Skelaxin    Levaquin [Levofloxacin]     Diarrhea, GI upset   Topiramate      Felt foggy , not healthy feeling   Zoloft [Sertraline Hcl]     Heart palpitations, PVCs.  Including adverse reaction to zoloft, paxil, lexapro   Zonisamide      Moderate diarrhea   Sulfa Antibiotics Rash   Past Surgical History:  Procedure Laterality Date   BREAST BIOPSY Bilateral    CESAREAN SECTION     CHOLECYSTECTOMY     COLONOSCOPY  10/21/2019   Dr. Maffucci Glendia, normal colon   ESOPHAGOGASTRODUODENOSCOPY     GASTRIC BYPASS     Reviewed their medical, surgical, family, social, medication, and allergy history and updated chart as appropriate.    Review of Systems  Constitutional:  Negative for chills, fever, malaise/fatigue and weight loss.  HENT:  Negative for congestion, ear pain, hearing loss, sore throat and  tinnitus.   Eyes:  Negative for blurred vision, pain and redness.  Respiratory:  Negative for cough, hemoptysis and shortness of breath.   Cardiovascular:  Negative for chest pain, palpitations, orthopnea, claudication and leg swelling.  Gastrointestinal:  Positive for constipation. Negative for abdominal pain, blood in stool, diarrhea, nausea and vomiting.  Genitourinary:  Negative for dysuria, flank pain, frequency, hematuria and urgency.  Musculoskeletal:  Negative for falls, joint pain and myalgias.  Skin:  Negative for itching and rash.  Neurological:  Negative for dizziness, tingling, speech change, weakness and headaches.  Endo/Heme/Allergies:  Negative for polydipsia. Does not bruise/bleed easily.  Psychiatric/Behavioral:  Negative for depression and memory loss. The patient is not nervous/anxious and does not have insomnia.        Objective:   BP 110/70   Pulse 64   Ht 5' 4.5 (1.638 m)   Wt 174 lb 9.6 oz (79.2 kg)   LMP 12/29/2023   BMI 29.51 kg/m   Wt Readings from Last 3 Encounters:  02/08/24 174 lb 9.6 oz (79.2 kg)  11/14/23 182 lb (82.6 kg)  07/21/23 194 lb 6.4 oz (88.2 kg)   BP Readings from Last 3 Encounters:  02/08/24 110/70  07/21/23 122/80  06/01/23 122/72    General appearance: alert, no distress, WD/WN, Caucasian female Skin: No worrisome lesions, scattered macules HEENT: normocephalic, conjunctiva/corneas normal, sclerae anicteric, PERRLA, EOMi, nares patent, no discharge or erythema, pharynx normal Oral cavity: MMM, tongue normal, teeth normal Neck: supple, no lymphadenopathy, no thyromegaly, no masses, normal ROM, no bruits Chest: non tender, normal shape and expansion Heart: RRR, normal S1, S2, no murmurs Lungs: CTA bilaterally, no wheezes, rhonchi, or rales Abdomen: +bs, faint surgical port scars, soft, non tender, non distended, no masses, no hepatomegaly, no splenomegaly, no bruits Back: non tender, normal ROM, no scoliosis Musculoskeletal: upper extremities non tender, no obvious deformity, normal ROM throughout, lower extremities non tender, no obvious deformity, normal ROM throughout Extremities: purple appearing distal feet and toes bilat, otherwise no edema, no clubbing Pulses: 2+ symmetric, upper and lower extremities, normal cap refill Neurological: alert, oriented x 3, CN2-12 intact, strength normal upper extremities and lower extremities, sensation normal throughout, DTRs 2+ throughout, no cerebellar signs, gait normal Psychiatric: normal affect, behavior normal, pleasant  Breast/gyn/rectal - deferred to gynecology     Assessment and Plan :   Encounter Diagnoses  Name Primary?   Encounter for health maintenance examination in adult Yes   Symptomatic PVCs    H/O gastric bypass    History of anemia    Vitamin D  deficiency    Vaccine counseling    Seizure disorder [facility])     Essential tremor    Screening for lipid disorders    Screening for thyroid  disorder     Today you had a preventative care visit or wellness visit.    Topics today may have included healthy lifestyle, diet, exercise, preventative care, vaccinations, sick and well care, proper use of emergency dept and after hours care, as well as other concerns.     Recommendations: Continue to return yearly for your annual wellness and preventative care visits.  This gives us  a chance to discuss healthy lifestyle, exercise, vaccinations, review your chart record, and perform screenings where appropriate.  I recommend you see your eye doctor yearly for routine vision care.  I recommend you see your dentist yearly for routine dental care including hygiene visits twice yearly.  See your gynecologist yearly for routine gynecological care.    Vaccination  recommendations were reviewed Immunization History  Administered Date(s) Administered   Hepatitis A, Adult 12/12/2014   Hepatitis B, ADULT 06/24/1999, 07/25/1999, 01/29/2014   Influenza Whole 04/03/2014, 04/11/2016, 04/12/2017, 04/04/2018   Influenza, Seasonal, Injecte, Preservative Fre 03/28/2023   Influenza-Unspecified 04/02/2019, 02/25/2021, 04/13/2022   MMR 06/14/2013, 07/12/2013   PFIZER(Purple Top)SARS-COV-2 Vaccination 02/20/2020, 03/12/2020   Tdap 01/29/2014   Zoster Recombinant(Shingrix ) 01/19/2022    I recommend a yearly flu shot I recommend updated Tdap I recommend updated Prevnar 20 pneumonia vaccine I recommend Shingrix  #2 Declines vaccines today   Screening for cancer: Breast cancer screening: You should perform a self breast exam monthly.   Mammogram is scheduled for today  Colon cancer screening:  I reviewed your colonoscopy on file that is up to date from 2021  Cervical cancer screening: We reviewed recommendations for pap smear screening.  Skin cancer screening: Check your skin regularly for new changes, growing  lesions, or other lesions of concern Come in for evaluation if you have skin lesions of concern.  Lung cancer screening: If you have a greater than 30 pack year history of tobacco use, then you qualify for lung cancer screening with a chest CT scan  We currently don't have screenings for other cancers besides breast, cervical, colon, and lung cancers.  If you have a strong family history of cancer or have other cancer screening concerns, please let me know.    Bone health: Get at least 150 minutes of aerobic exercise weekly Get weight bearing exercise at least once weekly   Heart health: Get at least 150 minutes of aerobic exercise weekly Limit alcohol It is important to maintain a healthy blood pressure and healthy cholesterol numbers    Separate significant issues discussed: Seizure disorder-I reviewed her 2023 neurology notes.  Patient had already self discontinued Trokendi  and primidone  by that visit, last seizure was 2011, and essential tremor was not significant enough to require medication at that time.  She continues off those medications at this point.  Essential tremor-off medication.  No particular significant issues  PVCs, sees cardiology-on Bystolic , does well on this  Obesity -continue with lifestyle changes to lose additional weight.  Chronic constipation-continue good water and fiber intake.  Continue MiraLAX as needed  Wyndi was seen today for annual exam.  Diagnoses and all orders for this visit:  Encounter for health maintenance examination in adult -     Iron , TIBC and Ferritin Panel -     Comprehensive metabolic panel with GFR -     Lipid panel -     CBC with Differential/Platelet -     Hemoglobin A1c -     TSH + free T4  Symptomatic PVCs  H/O gastric bypass  History of anemia -     Iron , TIBC and Ferritin Panel -     CBC with Differential/Platelet  Vitamin D  deficiency  Vaccine counseling  Seizure disorder (HCC)  Essential  tremor  Screening for lipid disorders -     Lipid panel  Screening for thyroid  disorder -     TSH + free T4   Follow-up pending labs, yearly for physical

## 2024-02-09 ENCOUNTER — Other Ambulatory Visit (HOSPITAL_BASED_OUTPATIENT_CLINIC_OR_DEPARTMENT_OTHER): Payer: Self-pay

## 2024-02-09 ENCOUNTER — Other Ambulatory Visit: Payer: Self-pay | Admitting: Obstetrics and Gynecology

## 2024-02-09 ENCOUNTER — Ambulatory Visit: Payer: Self-pay | Admitting: Medical

## 2024-02-09 ENCOUNTER — Other Ambulatory Visit: Payer: Self-pay | Admitting: Medical

## 2024-02-09 DIAGNOSIS — R9389 Abnormal findings on diagnostic imaging of other specified body structures: Secondary | ICD-10-CM

## 2024-02-09 LAB — CBC WITH DIFFERENTIAL/PLATELET
Basophils Absolute: 0 x10E3/uL (ref 0.0–0.2)
Basos: 0 %
EOS (ABSOLUTE): 0.1 x10E3/uL (ref 0.0–0.4)
Eos: 3 %
Hematocrit: 37.1 % (ref 34.0–46.6)
Hemoglobin: 11.9 g/dL (ref 11.1–15.9)
Immature Grans (Abs): 0 x10E3/uL (ref 0.0–0.1)
Immature Granulocytes: 0 %
Lymphocytes Absolute: 1.8 x10E3/uL (ref 0.7–3.1)
Lymphs: 35 %
MCH: 30.1 pg (ref 26.6–33.0)
MCHC: 32.1 g/dL (ref 31.5–35.7)
MCV: 94 fL (ref 79–97)
Monocytes Absolute: 0.5 x10E3/uL (ref 0.1–0.9)
Monocytes: 9 %
Neutrophils Absolute: 2.8 x10E3/uL (ref 1.4–7.0)
Neutrophils: 53 %
Platelets: 221 x10E3/uL (ref 150–450)
RBC: 3.96 x10E6/uL (ref 3.77–5.28)
RDW: 13.2 % (ref 11.7–15.4)
WBC: 5.3 x10E3/uL (ref 3.4–10.8)

## 2024-02-09 LAB — COMPREHENSIVE METABOLIC PANEL WITH GFR
ALT: 16 IU/L (ref 0–32)
AST: 22 IU/L (ref 0–40)
Albumin: 4.1 g/dL (ref 3.8–4.9)
Alkaline Phosphatase: 71 IU/L (ref 44–121)
BUN/Creatinine Ratio: 11 (ref 9–23)
BUN: 9 mg/dL (ref 6–24)
Bilirubin Total: 0.3 mg/dL (ref 0.0–1.2)
CO2: 21 mmol/L (ref 20–29)
Calcium: 9.2 mg/dL (ref 8.7–10.2)
Chloride: 102 mmol/L (ref 96–106)
Creatinine, Ser: 0.83 mg/dL (ref 0.57–1.00)
Globulin, Total: 2.3 g/dL (ref 1.5–4.5)
Glucose: 76 mg/dL (ref 70–99)
Potassium: 4.4 mmol/L (ref 3.5–5.2)
Sodium: 139 mmol/L (ref 134–144)
Total Protein: 6.4 g/dL (ref 6.0–8.5)
eGFR: 84 mL/min/1.73 (ref >59)

## 2024-02-09 LAB — LIPID PANEL
Chol/HDL Ratio: 2.4 ratio (ref 0.0–4.4)
Cholesterol, Total: 193 mg/dL (ref 100–199)
HDL: 80 mg/dL (ref >39)
LDL Chol Calc (NIH): 98 mg/dL (ref 0–99)
Triglycerides: 83 mg/dL (ref 0–149)
VLDL Cholesterol Cal: 15 mg/dL (ref 5–40)

## 2024-02-09 LAB — IRON,TIBC AND FERRITIN PANEL
Ferritin: 10 ng/mL — ABNORMAL LOW (ref 15–150)
Iron Saturation: 24 % (ref 15–55)
Iron: 97 ug/dL (ref 27–159)
Total Iron Binding Capacity: 397 ug/dL (ref 250–450)
UIBC: 300 ug/dL (ref 131–425)

## 2024-02-09 LAB — TSH+FREE T4
Free T4: 1.21 ng/dL (ref 0.82–1.77)
TSH: 1.48 u[IU]/mL (ref 0.450–4.500)

## 2024-02-09 LAB — HEMOGLOBIN A1C
Est. average glucose Bld gHb Est-mCnc: 105 mg/dL
Hgb A1c MFr Bld: 5.3 % (ref 4.8–5.6)

## 2024-02-09 MED ORDER — FERROUS GLUCONATE 324 (38 FE) MG PO TABS
324.0000 mg | ORAL_TABLET | Freq: Every day | ORAL | 1 refills | Status: AC
  Filled 2024-02-09: qty 90, 90d supply, fill #0

## 2024-02-09 NOTE — Progress Notes (Signed)
 Results through MyChart

## 2024-02-15 ENCOUNTER — Ambulatory Visit
Admission: RE | Admit: 2024-02-15 | Discharge: 2024-02-15 | Disposition: A | Discharge status: Home / Self Care | Source: Ambulatory Visit | Attending: Obstetrics and Gynecology | Admitting: Obstetrics and Gynecology

## 2024-02-15 DIAGNOSIS — N6031 Fibrosclerosis of right breast: Secondary | ICD-10-CM | POA: Diagnosis not present

## 2024-02-15 DIAGNOSIS — R928 Other abnormal and inconclusive findings on diagnostic imaging of breast: Secondary | ICD-10-CM | POA: Diagnosis not present

## 2024-02-15 DIAGNOSIS — R9389 Abnormal findings on diagnostic imaging of other specified body structures: Secondary | ICD-10-CM

## 2024-02-15 MED ORDER — GADOPICLENOL 0.5 MMOL/ML IV SOLN
9.0000 mL | Freq: Once | INTRAVENOUS | Status: AC | PRN
  Administered 2024-02-15: 9 mL via INTRAVENOUS

## 2024-02-16 LAB — SURGICAL PATHOLOGY

## 2024-02-19 ENCOUNTER — Other Ambulatory Visit (HOSPITAL_BASED_OUTPATIENT_CLINIC_OR_DEPARTMENT_OTHER): Payer: Self-pay

## 2024-03-07 ENCOUNTER — Other Ambulatory Visit (HOSPITAL_COMMUNITY): Payer: Self-pay

## 2024-03-08 ENCOUNTER — Other Ambulatory Visit (HOSPITAL_COMMUNITY): Payer: Self-pay

## 2024-03-11 ENCOUNTER — Other Ambulatory Visit (HOSPITAL_COMMUNITY): Payer: Self-pay

## 2024-03-11 ENCOUNTER — Other Ambulatory Visit: Payer: Self-pay

## 2024-03-11 ENCOUNTER — Encounter: Payer: Self-pay | Admitting: Pharmacist

## 2024-03-11 ENCOUNTER — Other Ambulatory Visit (HOSPITAL_BASED_OUTPATIENT_CLINIC_OR_DEPARTMENT_OTHER): Payer: Self-pay

## 2024-03-12 ENCOUNTER — Other Ambulatory Visit: Payer: Self-pay

## 2024-04-01 ENCOUNTER — Other Ambulatory Visit (HOSPITAL_BASED_OUTPATIENT_CLINIC_OR_DEPARTMENT_OTHER): Payer: Self-pay

## 2024-04-22 ENCOUNTER — Telehealth: Admitting: Family Medicine

## 2024-04-22 DIAGNOSIS — H6992 Unspecified Eustachian tube disorder, left ear: Secondary | ICD-10-CM | POA: Diagnosis not present

## 2024-04-22 MED ORDER — IPRATROPIUM BROMIDE 0.03 % NA SOLN: 2.0000 | Freq: Two times a day (BID) | NASAL | 0 refills | Status: AC

## 2024-04-22 NOTE — Progress Notes (Signed)

## 2024-05-06 ENCOUNTER — Telehealth: Payer: Self-pay | Admitting: Cardiology

## 2024-05-06 NOTE — Telephone Encounter (Signed)
 Pt c/o medication issue:  1. Name of Medication:   nebivolol  (BYSTOLIC ) 5 MG tablet    2. How are you currently taking this medication (dosage and times per day)?    3. Are you having a reaction (difficulty breathing--STAT)? no  4. What is your medication issue? Patient states that her insurance is not longer cover, she states that they are sending us  exemption paperwork for our office to filled out. Also she is asking if we have any samples. Please advise

## 2024-05-06 NOTE — Telephone Encounter (Signed)
 No samples of Bystolic  at this time. Do you want to try a alternative medication or wait for paperwork?

## 2024-05-07 ENCOUNTER — Other Ambulatory Visit (HOSPITAL_COMMUNITY): Payer: Self-pay

## 2024-05-07 ENCOUNTER — Telehealth: Payer: Self-pay | Admitting: Pharmacy Technician

## 2024-05-07 ENCOUNTER — Other Ambulatory Visit (HOSPITAL_BASED_OUTPATIENT_CLINIC_OR_DEPARTMENT_OTHER): Payer: Self-pay

## 2024-05-07 NOTE — Telephone Encounter (Signed)
 Please review telephone encounter from [facility].

## 2024-05-07 NOTE — Telephone Encounter (Addendum)
    I got a coupon and bypassing insurance it would be 102.78 for 30 days on just the coupon. And 235.84 for 90 days    [phone] insurance phone expedite -[facility] said [facility] system is [facility]-will try later   Pharmacy Patient Advocate Encounter   Received notification from Pt Calls Messages that prior authorization for bystolic  is required/requested.   Insurance verification completed.   The patient is insured through [facility].   Per test claim: PA required; PA started via CoverMyMeds. KEY [id number] . Waiting for clinical questions to populate.   has two tablets of name brand bystolic  and she has to have name brand. She has reactions to other alternatives and the generic is not effective. -per [facility]oke

## 2024-05-07 NOTE — Telephone Encounter (Signed)
 Whatever the patient prefers. She has been very particular about wanting to sue Bystolic  in the past.  Thanks MJP

## 2024-05-08 MED ORDER — OMEPRAZOLE 40 MG PO CPDR: 40.0000 mg | DELAYED_RELEASE_CAPSULE | Freq: Every day | ORAL | 1 refills | Status: AC

## 2024-05-08 NOTE — Telephone Encounter (Signed)
 Pharmacy Patient Advocate Encounter   Received notification from [facility] that prior authorization for bystolic  5mg  is required/requested.   Insurance verification completed.   The patient is insured through [facility].   Per test claim: PA required; PA submitted to above mentioned insurance via Latent Key/confirmation #/EOC [id number] Status is pending

## 2024-05-08 NOTE — Telephone Encounter (Signed)
 Symptomatic PVC's: Well-controlled on Bystolic . Bystolic  preferred, instead of propranolol  (due to side effects) or generic nebivolol  (which she has not tolerated).  If still not approved by insurance, may have to try try generic nebivolol  or change to diltiazem CD 180 mg daily.  Thanks MJP

## 2024-05-08 NOTE — Telephone Encounter (Signed)
 Hi, per insurance after sending all the office notes I could find on this:    I can only find this note: bystolic  preferred, instead of propranolol  (due to side effects) or generic nebivolol  (which she has not tolerated). She told me verbally that the generic bystolic  made her blood pressure drop too much. I may be overlooking, but I couldn't find that the generic caused her that problem in any doctor notes. Insurance is asking that she tries and fails or has intolerances to 3 alternatives before they would consider approving the brand bystolic  and we would need the reasons documented so we can send to insurance. Thank you!

## 2024-05-08 NOTE — Telephone Encounter (Signed)
 Insurance is asking that she tries and fails or has intolerances to 3 alternatives before they would consider approving the brand bystolic  and we would need the reasons documented so we can send to insurance   Please advise.

## 2024-05-08 NOTE — Telephone Encounter (Signed)
 Correction: Pt reports that generic Bystolic  doesn't help the PVC's and she feels like she is not taking the medication at all. Pt reports that atenolol and metoprolol drop her blood pressure.   Side note: pt is going out of town on Friday

## 2024-05-08 NOTE — Telephone Encounter (Signed)
 We can try half the dose of generic Nebivolol .  Thanks MJP

## 2024-05-09 ENCOUNTER — Other Ambulatory Visit (HOSPITAL_COMMUNITY): Payer: Self-pay

## 2024-05-09 NOTE — Telephone Encounter (Addendum)
   [id number] -latent closed  [id number]-also in cmm

## 2024-05-09 NOTE — Progress Notes (Signed)
 Pt reports that generic Bystolic  (Nebivolol ) doesn't help the PVC's and she feels like she is not taking the medication at all. Pt reports that Atenolol and Metoprolol drop her blood pressure.

## 2024-05-10 ENCOUNTER — Other Ambulatory Visit (HOSPITAL_COMMUNITY): Payer: Self-pay

## 2024-05-10 NOTE — Telephone Encounter (Signed)
   [phone]  Faxed appeal to [phone]  and fax-[phone] as requested

## 2024-05-13 NOTE — Telephone Encounter (Signed)
 I called and they verified they got the appeal and new decision date of 05/24/24

## 2024-05-15 ENCOUNTER — Other Ambulatory Visit (HOSPITAL_COMMUNITY): Payer: Self-pay

## 2024-05-15 NOTE — Telephone Encounter (Signed)
 Pharmacy Patient Advocate Encounter  Received notification from [facility] that Prior Authorization for bystolic  has been APPROVED from 05/15/24 to 05/15/25. I called [facility] and their system is down right now but they will run it once the system is back up. I called the patient and made her aware . Looks to be 18.32 for 30 days
